# Patient Record
Sex: Female | Born: 1975 | Race: White | Hispanic: No | Marital: Married | Smoking: Never smoker
Health system: Southern US, Community
[De-identification: ages and names within clinical notes are randomized; demographics above are authoritative.]

## PROBLEM LIST (undated history)

## (undated) DIAGNOSIS — K5090 Crohn's disease, unspecified, without complications: Secondary | ICD-10-CM

## (undated) DIAGNOSIS — Z8489 Family history of other specified conditions: Secondary | ICD-10-CM

## (undated) DIAGNOSIS — T8859XA Other complications of anesthesia, initial encounter: Secondary | ICD-10-CM

## (undated) DIAGNOSIS — B019 Varicella without complication: Secondary | ICD-10-CM

## (undated) DIAGNOSIS — N809 Endometriosis, unspecified: Secondary | ICD-10-CM

## (undated) DIAGNOSIS — Z789 Other specified health status: Secondary | ICD-10-CM

## (undated) DIAGNOSIS — T4145XA Adverse effect of unspecified anesthetic, initial encounter: Secondary | ICD-10-CM

## (undated) HISTORY — PX: CERVICAL BIOPSY  W/ LOOP ELECTRODE EXCISION: SUR135

## (undated) HISTORY — PX: OTHER SURGICAL HISTORY: SHX169

## (undated) HISTORY — PX: COLON SURGERY: SHX602

## (undated) HISTORY — PX: DIAGNOSTIC LAPAROSCOPY: SUR761

## (undated) HISTORY — PX: ABDOMINAL HYSTERECTOMY: SHX81

---

## 2007-09-24 ENCOUNTER — Ambulatory Visit: Payer: Self-pay | Admitting: Unknown Physician Specialty

## 2007-10-22 ENCOUNTER — Ambulatory Visit: Payer: Self-pay | Admitting: Unknown Physician Specialty

## 2008-03-04 ENCOUNTER — Ambulatory Visit: Payer: Self-pay | Admitting: Unknown Physician Specialty

## 2008-07-02 ENCOUNTER — Ambulatory Visit: Payer: Self-pay | Admitting: Unknown Physician Specialty

## 2008-07-28 ENCOUNTER — Ambulatory Visit: Payer: Self-pay | Admitting: Unknown Physician Specialty

## 2008-08-20 ENCOUNTER — Ambulatory Visit: Payer: Self-pay | Admitting: Surgery

## 2008-08-25 ENCOUNTER — Inpatient Hospital Stay: Payer: Self-pay | Admitting: Surgery

## 2009-02-15 ENCOUNTER — Ambulatory Visit: Payer: Self-pay | Admitting: Unknown Physician Specialty

## 2013-04-03 ENCOUNTER — Ambulatory Visit: Payer: Self-pay | Admitting: Podiatry

## 2014-12-02 ENCOUNTER — Other Ambulatory Visit: Payer: Self-pay | Admitting: Family Medicine

## 2014-12-02 ENCOUNTER — Ambulatory Visit
Admission: RE | Admit: 2014-12-02 | Discharge: 2014-12-02 | Disposition: A | Discharge status: Home / Self Care | Payer: 59 | Source: Ambulatory Visit | Attending: Family Medicine | Admitting: Family Medicine

## 2014-12-02 DIAGNOSIS — R109 Unspecified abdominal pain: Secondary | ICD-10-CM | POA: Diagnosis present

## 2014-12-02 DIAGNOSIS — N1330 Unspecified hydronephrosis: Secondary | ICD-10-CM | POA: Insufficient documentation

## 2015-11-01 ENCOUNTER — Other Ambulatory Visit: Payer: Self-pay | Admitting: Unknown Physician Specialty

## 2015-11-01 DIAGNOSIS — Z1231 Encounter for screening mammogram for malignant neoplasm of breast: Secondary | ICD-10-CM

## 2015-11-10 ENCOUNTER — Ambulatory Visit
Admission: RE | Admit: 2015-11-10 | Discharge: 2015-11-10 | Disposition: A | Discharge status: Home / Self Care | Payer: 59 | Source: Ambulatory Visit | Attending: Unknown Physician Specialty | Admitting: Unknown Physician Specialty

## 2015-11-10 DIAGNOSIS — Z1231 Encounter for screening mammogram for malignant neoplasm of breast: Secondary | ICD-10-CM

## 2016-05-04 ENCOUNTER — Encounter: Payer: Self-pay | Admitting: *Deleted

## 2016-05-07 ENCOUNTER — Ambulatory Visit: Payer: 59 | Admitting: Anesthesiology

## 2016-05-07 ENCOUNTER — Ambulatory Visit
Admission: RE | Admit: 2016-05-07 | Discharge: 2016-05-07 | Disposition: A | Discharge status: Home / Self Care | Payer: 59 | Source: Ambulatory Visit | Attending: Gastroenterology | Admitting: Gastroenterology

## 2016-05-07 ENCOUNTER — Encounter: Payer: Self-pay | Admitting: *Deleted

## 2016-05-07 ENCOUNTER — Encounter
Admission: RE | Disposition: A | Discharge status: Home / Self Care | Payer: Self-pay | Source: Ambulatory Visit | Attending: Gastroenterology

## 2016-05-07 ENCOUNTER — Other Ambulatory Visit: Payer: Self-pay | Admitting: Gastroenterology

## 2016-05-07 DIAGNOSIS — Z7951 Long term (current) use of inhaled steroids: Secondary | ICD-10-CM | POA: Insufficient documentation

## 2016-05-07 DIAGNOSIS — Z1211 Encounter for screening for malignant neoplasm of colon: Secondary | ICD-10-CM | POA: Diagnosis present

## 2016-05-07 DIAGNOSIS — K50919 Crohn's disease, unspecified, with unspecified complications: Secondary | ICD-10-CM

## 2016-05-07 DIAGNOSIS — K5080 Crohn's disease of both small and large intestine without complications: Secondary | ICD-10-CM | POA: Diagnosis not present

## 2016-05-07 DIAGNOSIS — Z980 Intestinal bypass and anastomosis status: Secondary | ICD-10-CM | POA: Diagnosis not present

## 2016-05-07 HISTORY — DX: Endometriosis, unspecified: N80.9

## 2016-05-07 HISTORY — DX: Varicella without complication: B01.9

## 2016-05-07 HISTORY — DX: Crohn's disease, unspecified, without complications: K50.90

## 2016-05-07 HISTORY — PX: COLONOSCOPY WITH PROPOFOL: SHX5780

## 2016-05-07 LAB — POCT PREGNANCY, URINE: PREG TEST UR: NEGATIVE

## 2016-05-07 SURGERY — COLONOSCOPY WITH PROPOFOL
Anesthesia: General

## 2016-05-07 MED ORDER — SODIUM CHLORIDE 0.9 % IV SOLN
INTRAVENOUS | Status: DC
  Administered 2016-05-07: 1000 mL via INTRAVENOUS

## 2016-05-07 MED ORDER — PROPOFOL 500 MG/50ML IV EMUL
INTRAVENOUS | Status: DC | PRN
  Administered 2016-05-07: 100 ug/kg/min via INTRAVENOUS

## 2016-05-07 MED ORDER — FENTANYL CITRATE (PF) 100 MCG/2ML IJ SOLN
INTRAMUSCULAR | Status: DC | PRN
  Administered 2016-05-07: 50 ug via INTRAVENOUS

## 2016-05-07 MED ORDER — LIDOCAINE HCL (CARDIAC) 20 MG/ML IV SOLN
INTRAVENOUS | Status: DC | PRN
  Administered 2016-05-07: 25 mg via INTRAVENOUS

## 2016-05-07 MED ORDER — PROPOFOL 10 MG/ML IV BOLUS
INTRAVENOUS | Status: DC | PRN
  Administered 2016-05-07: 700 mg via INTRAVENOUS

## 2016-05-07 MED ORDER — SODIUM CHLORIDE 0.9 % IV SOLN: INTRAVENOUS | Status: DC

## 2016-05-07 NOTE — Anesthesia Preprocedure Evaluation (Signed)
Anesthesia Evaluation  Patient identified by MRN, date of birth, ID band Patient awake    Reviewed: Allergy & Precautions, H&P , NPO status , Patient's Chart, lab work & pertinent test results, reviewed documented beta blocker date and time   Airway Mallampati: II   Neck ROM: full    Dental  (+) Poor Dentition   Pulmonary neg pulmonary ROS,    Pulmonary exam normal        Cardiovascular negative cardio ROS Normal cardiovascular exam     Neuro/Psych negative neurological ROS  negative psych ROS   GI/Hepatic negative GI ROS, Neg liver ROS,   Endo/Other  negative endocrine ROS  Renal/GU negative Renal ROS  negative genitourinary   Musculoskeletal   Abdominal   Peds  Hematology negative hematology ROS (+)   Anesthesia Other Findings Past Medical History: No date: Chicken pox No date: Crohn disease (HCC) No date: Endometriosis Past Surgical History: No date: CERVICAL BIOPSY  W/ LOOP ELECTRODE EXCISION No date: COLON SURGERY     Comment: partial enterectomy with repair of sigmoid               colon BMI    Body Mass Index:  30.65 kg/m     Reproductive/Obstetrics                             Anesthesia Physical Anesthesia Plan  ASA: II  Anesthesia Plan: General   Post-op Pain Management:    Induction:   Airway Management Planned:   Additional Equipment:   Intra-op Plan:   Post-operative Plan:   Informed Consent: I have reviewed the patients History and Physical, chart, labs and discussed the procedure including the risks, benefits and alternatives for the proposed anesthesia with the patient or authorized representative who has indicated his/her understanding and acceptance.   Dental Advisory Given  Plan Discussed with: CRNA  Anesthesia Plan Comments:         Anesthesia Quick Evaluation

## 2016-05-07 NOTE — Transfer of Care (Signed)
Immediate Anesthesia Transfer of Care Note  Patient: Alicia Townsend  Procedure(s) Performed: Procedure(s): COLONOSCOPY WITH PROPOFOL (N/A)  Patient Location: PACU and Endoscopy Unit  Anesthesia Type:General  Level of Consciousness: awake  Airway & Oxygen Therapy: Patient Spontanous Breathing  Post-op Assessment: Report given to RN  Post vital signs: stable  Last Vitals:  Vitals:   05/07/16 0853 05/07/16 1059  BP: 123/80 106/76  Pulse: (!) 105 88  Resp: 16 (!) 21  Temp: 36.6 C 36.7 C    Last Pain:  Vitals:   05/07/16 1059  TempSrc: Tympanic         Complications: No apparent anesthesia complications

## 2016-05-07 NOTE — Anesthesia Postprocedure Evaluation (Signed)
Anesthesia Post Note  Patient: Alicia Townsend  Procedure(s) Performed: Procedure(s) (LRB): COLONOSCOPY WITH PROPOFOL (N/A)  Patient location during evaluation: PACU Anesthesia Type: General Level of consciousness: awake and alert Pain management: pain level controlled Vital Signs Assessment: post-procedure vital signs reviewed and stable Respiratory status: spontaneous breathing, nonlabored ventilation, respiratory function stable and patient connected to nasal cannula oxygen Cardiovascular status: blood pressure returned to baseline and stable Postop Assessment: no signs of nausea or vomiting Anesthetic complications: no    Last Vitals:  Vitals:   05/07/16 1119 05/07/16 1129  BP: 117/63 109/70  Pulse: 86 81  Resp: 19 17  Temp:      Last Pain:  Vitals:   05/07/16 1059  TempSrc: Tympanic                 Yevette EdwardsJames G Adams

## 2016-05-07 NOTE — H&P (Signed)
Outpatient short stay form Pre-procedure 05/07/2016 10:22 AM Alicia Sails MD  Primary Physician: Dr Derinda Late  Reason for visit:  Colonoscopy  History of present illness:  Patient is a 40 year old female presenting today as above. She has personal history of Crohn's disease with her last colonoscopy being done 02/15/2009. She has a history of Crohn's disease of the large and small bowel. She did have a surgery in the past which sounds like a ileocectomy. She had been on symptoms he up to about 6 months ago.  She feels overall she is doing well. She is not currently on a maintenance medication. She tolerated her prep well. She denies use of any aspirin or blood thinning agents.    Current Facility-Administered Medications:  .  0.9 %  sodium chloride infusion, , Intravenous, Continuous, Alicia Sails, MD, Last Rate: 20 mL/hr at 05/07/16 0909, 1,000 mL at 05/07/16 0909 .  0.9 %  sodium chloride infusion, , Intravenous, Continuous, Alicia Sails, MD  Prescriptions Prior to Admission  Medication Sig Dispense Refill Last Dose  . Certolizumab Pegol 2 X 200 MG/ML KIT Inject into the skin. Certolizumab pegol 863OT sq dose x1. Then repeat in 2 weeks. Than maintenance dosing of 437m sq monthly   Past Month at Unknown time  . fluticasone (FLONASE) 50 MCG/ACT nasal spray Place 1 spray into both nostrils 2 (two) times daily.   Not Taking at Unknown time  . medroxyPROGESTERone (PROVERA) 10 MG tablet Take 10 mg by mouth daily.   Not Taking at Unknown time     No Known Allergies   Past Medical History:  Diagnosis Date  . Chicken pox   . Crohn disease ([place]   . Endometriosis     Review of systems:      Physical Exam    Heart and lungs: Regular rate and rhythm without rub or gallop, lungs are bilaterally clear.   HEENT: Normocephalic atraumatic eyes are anicteric    Other:     Pertinant exam for procedure: Soft nontender nondistended bowel sounds positive  normoactive.    Planned proceedures: Colonoscopy and indicated procedures.    MLollie Sails MD Gastroenterology 05/07/2016  10:22 AM

## 2016-05-07 NOTE — Op Note (Signed)
[facility] Gastroenterology Patient Name: Alicia LofflerCrystal Townsend Procedure Date: 05/07/2016 10:28 AM MRN: [id number] Account #: [id number] Date of Birth: 09/30/1975 Admit Type: Outpatient Age: [age] [facility] Gender: Female Note Status: Finalized Procedure:            Colonoscopy Indications:          Personal history of Crohn's disease Providers:            Christena DeemMartin U. Skulskie, MD Referring MD:         Hassell HalimMarcus E. Babaoff MD (Referring MD) Medicines:            Monitored Anesthesia Care Complications:        No immediate complications. Procedure:            Pre-Anesthesia Assessment:                       - ASA Grade Assessment: II - A patient with mild                        systemic disease.                       After obtaining informed consent, the colonoscope was                        passed under direct vision. Throughout the procedure,                        the patient's blood pressure, pulse, and oxygen                        saturations were monitored continuously. The                        Colonoscope was introduced through the anus and                        advanced to the the ileocolonic anastomosis. The                        colonoscopy was performed without difficulty. The                        patient tolerated the procedure well. The quality of                        the bowel preparation was good. Findings:      There was evidence of a prior functional end-to-end ileo-colonic       anastomosis in the proximal ascending colon. This was patent and was       characterized by healthy appearing mucosa. The anastomosis was traversed.      Biopsies for histology were taken with a cold forceps from the ascending       colon, transverse colon, descending colon, sigmoid colon and rectum and       neoterminal ileum for evaluation of microscopic colitis.      The digital rectal exam was normal. Pertinent negatives include possible       small  non-inflamed old filtular opening at the anterior anal orifice.      The retroflexed view of the distal rectum and anal verge  was normal and       showed no anal or rectal abnormalities. Impression:           - Patent functional end-to-end ileo-colonic                        anastomosis, characterized by healthy appearing mucosa.                       - The distal rectum and anal verge are normal on                        retroflexion view.                       - Biopsies were taken with a cold forceps from the                        ascending colon, transverse colon, descending colon,                        sigmoid colon and rectum for evaluation of microscopic                        colitis. Recommendation:       - Discharge patient to home. Procedure Code(s):    --- Professional ---                       [phone], Colonoscopy, flexible; with biopsy, single or                        multiple Diagnosis Code(s):    --- Professional ---                       Z98.0, Intestinal bypass and anastomosis status                       Z87.19, Personal history of other diseases of the                        digestive system CPT copyright 2016 American Medical Association. All rights reserved. The codes documented in this report are preliminary and upon coder review may  be revised to meet current compliance requirements. Christena DeemMartin U Skulskie, MD 05/07/2016 11:00:26 AM This report has been signed electronically. Number of Addenda: 0 Note Initiated On: 05/07/2016 10:28 AM Scope Withdrawal Time: 0 hours 12 minutes 23 seconds  Total Procedure Duration: 0 hours 16 minutes 28 seconds       [facility]

## 2016-05-08 LAB — SURGICAL PATHOLOGY

## 2016-05-09 ENCOUNTER — Encounter: Payer: Self-pay | Admitting: Gastroenterology

## 2016-05-15 ENCOUNTER — Ambulatory Visit
Admission: RE | Admit: 2016-05-15 | Discharge: 2016-05-15 | Disposition: A | Discharge status: Home / Self Care | Payer: 59 | Source: Ambulatory Visit | Attending: Gastroenterology | Admitting: Gastroenterology

## 2016-05-15 DIAGNOSIS — K50919 Crohn's disease, unspecified, with unspecified complications: Secondary | ICD-10-CM | POA: Insufficient documentation

## 2016-05-31 ENCOUNTER — Other Ambulatory Visit: Payer: 59

## 2016-05-31 DIAGNOSIS — Z01812 Encounter for preprocedural laboratory examination: Secondary | ICD-10-CM | POA: Diagnosis present

## 2016-05-31 LAB — TYPE AND SCREEN
ABO/RH(D): O NEG
Antibody Screen: NEGATIVE

## 2016-05-31 NOTE — Patient Instructions (Signed)
  Your procedure is scheduled on: 06/05/16 Report to Day Surgery. [facility]OND FLOOR To find out your arrival time please call [phone] between 1PM - 3PM on 06/04/16.  Remember: Instructions that are not followed completely may result in serious medical risk, up to and including death, or upon the discretion of your surgeon and anesthesiologist your surgery may need to be rescheduled.    _X_ 1. Do not eat food or drink liquids after midnight. No gum chewing or hard candies.     ____ 2. No Alcohol for 24 hours before or after surgery.   ____ 3. Do Not Smoke For 24 Hours Prior to Your Surgery.   ____ 4. Bring all medications with you on the day of surgery if instructed.    __X 5. Notify your doctor if there is any change in your medical condition     (cold, fever, infections).       Do not wear jewelry, make-up, hairpins, clips or nail polish.  Do not wear lotions, powders, or perfumes. You may wear deodorant.  Do not shave 48 hours prior to surgery. Men may shave face and neck.  Do not bring valuables to the hospital.    [facility] is not responsible for any belongings or valuables.               Contacts, dentures or bridgework may not be worn into surgery.  Leave your suitcase in the car. After surgery it may be brought to your room.  For patients admitted to the hospital, discharge time is determined by your                treatment team.   Patients discharged the day of surgery will not be allowed to drive home.   ____ Take these medicines the morning of surgery with A SIP OF WATER:    1. NONE  2.   3.   4.  5.  6.  ____ Fleet Enema (as directed)   X___ Use CHG Soap as directed  ____ Use inhalers on the day of surgery  ____ Stop metformin 2 days prior to surgery    ____ Take 1/2 of usual insulin dose the night before surgery and none on the morning of surgery.   ____ Stop Coumadin/Plavix/aspirin on   ____ Stop Anti-inflammatories on    ____ Stop  supplements until after surgery.    ____ Bring C-Pap to the hospital.

## 2016-06-01 ENCOUNTER — Encounter
Admission: RE | Admit: 2016-06-01 | Discharge: 2016-06-01 | Disposition: A | Discharge status: Home / Self Care | Payer: 59 | Source: Ambulatory Visit | Attending: Obstetrics and Gynecology | Admitting: Obstetrics and Gynecology

## 2016-06-01 ENCOUNTER — Other Ambulatory Visit: Payer: 59

## 2016-06-01 DIAGNOSIS — Z01812 Encounter for preprocedural laboratory examination: Secondary | ICD-10-CM | POA: Diagnosis not present

## 2016-06-01 HISTORY — DX: Other complications of anesthesia, initial encounter: T88.59XA

## 2016-06-01 HISTORY — DX: Family history of other specified conditions: Z84.89

## 2016-06-01 HISTORY — DX: Adverse effect of unspecified anesthetic, initial encounter: T41.45XA

## 2016-06-01 HISTORY — DX: Other specified health status: Z78.9

## 2016-06-01 LAB — CBC
HCT: 40.8 % (ref 35.0–47.0)
HEMOGLOBIN: 14 g/dL (ref 12.0–16.0)
MCH: 29.6 pg (ref 26.0–34.0)
MCHC: 34.2 g/dL (ref 32.0–36.0)
MCV: 86.7 fL (ref 80.0–100.0)
PLATELETS: 262 10*3/uL (ref 150–440)
RBC: 4.71 MIL/uL (ref 3.80–5.20)
RDW: 13.8 % (ref 11.5–14.5)
WBC: 9.5 10*3/uL (ref 3.6–11.0)

## 2016-06-01 LAB — HCG, QUANTITATIVE, PREGNANCY

## 2016-06-05 ENCOUNTER — Observation Stay
Admission: RE | Admit: 2016-06-05 | Discharge: 2016-06-06 | Disposition: A | Discharge status: Home / Self Care | Payer: 59 | Source: Ambulatory Visit | Attending: Obstetrics and Gynecology | Admitting: Obstetrics and Gynecology

## 2016-06-05 ENCOUNTER — Ambulatory Visit: Payer: 59 | Admitting: Anesthesiology

## 2016-06-05 ENCOUNTER — Encounter
Admission: RE | Disposition: A | Discharge status: Home / Self Care | Payer: Self-pay | Source: Ambulatory Visit | Attending: Obstetrics and Gynecology

## 2016-06-05 ENCOUNTER — Encounter: Payer: Self-pay | Admitting: *Deleted

## 2016-06-05 DIAGNOSIS — Z90710 Acquired absence of both cervix and uterus: Secondary | ICD-10-CM | POA: Diagnosis present

## 2016-06-05 DIAGNOSIS — N920 Excessive and frequent menstruation with regular cycle: Principal | ICD-10-CM | POA: Insufficient documentation

## 2016-06-05 DIAGNOSIS — N800 Endometriosis of uterus: Secondary | ICD-10-CM | POA: Insufficient documentation

## 2016-06-05 DIAGNOSIS — N946 Dysmenorrhea, unspecified: Secondary | ICD-10-CM | POA: Insufficient documentation

## 2016-06-05 HISTORY — PX: LAPAROSCOPIC HYSTERECTOMY: SHX1926

## 2016-06-05 HISTORY — PX: CYSTOSCOPY: SHX5120

## 2016-06-05 LAB — POCT PREGNANCY, URINE: PREG TEST UR: NEGATIVE

## 2016-06-05 LAB — ABO/RH: ABO/RH(D): O NEG

## 2016-06-05 SURGERY — HYSTERECTOMY, TOTAL, LAPAROSCOPIC
Anesthesia: General | Laterality: Bilateral

## 2016-06-05 MED ORDER — KETOROLAC TROMETHAMINE 30 MG/ML IJ SOLN
30.0000 mg | Freq: Four times a day (QID) | INTRAMUSCULAR | Status: DC
  Administered 2016-06-05 – 2016-06-06 (×4): 30 mg via INTRAVENOUS
  Filled 2016-06-05 (×4): qty 1

## 2016-06-05 MED ORDER — ROCURONIUM BROMIDE 100 MG/10ML IV SOLN
INTRAVENOUS | Status: DC | PRN
  Administered 2016-06-05: 20 mg via INTRAVENOUS
  Administered 2016-06-05: 30 mg via INTRAVENOUS
  Administered 2016-06-05: 50 mg via INTRAVENOUS

## 2016-06-05 MED ORDER — ONDANSETRON HCL 4 MG/2ML IJ SOLN
INTRAMUSCULAR | Status: DC | PRN
  Administered 2016-06-05: 4 mg via INTRAVENOUS

## 2016-06-05 MED ORDER — FENTANYL CITRATE (PF) 100 MCG/2ML IJ SOLN
INTRAMUSCULAR | Status: DC | PRN
  Administered 2016-06-05: 100 ug via INTRAVENOUS
  Administered 2016-06-05 (×2): 50 ug via INTRAVENOUS

## 2016-06-05 MED ORDER — PROMETHAZINE HCL 25 MG/ML IJ SOLN: 12.5000 mg | Freq: Four times a day (QID) | INTRAMUSCULAR | Status: DC | PRN

## 2016-06-05 MED ORDER — CEFAZOLIN SODIUM-DEXTROSE 2-4 GM/100ML-% IV SOLN
INTRAVENOUS | Status: AC
  Filled 2016-06-05: qty 100

## 2016-06-05 MED ORDER — OXYCODONE HCL 5 MG PO TABS: 5.0000 mg | ORAL_TABLET | Freq: Once | ORAL | Status: DC | PRN

## 2016-06-05 MED ORDER — LIDOCAINE HCL (CARDIAC) 20 MG/ML IV SOLN
INTRAVENOUS | Status: DC | PRN
Start: 2016-06-05 — End: 2016-06-05
  Administered 2016-06-05: 100 mg via INTRAVENOUS

## 2016-06-05 MED ORDER — DEXTROSE-NACL 5-0.45 % IV SOLN
INTRAVENOUS | Status: DC
  Administered 2016-06-05 – 2016-06-06 (×2): via INTRAVENOUS

## 2016-06-05 MED ORDER — ACETAMINOPHEN 10 MG/ML IV SOLN
INTRAVENOUS | Status: DC | PRN
  Administered 2016-06-05: 1000 mg via INTRAVENOUS

## 2016-06-05 MED ORDER — PROPOFOL 10 MG/ML IV BOLUS
INTRAVENOUS | Status: DC | PRN
  Administered 2016-06-05: 150 mg via INTRAVENOUS

## 2016-06-05 MED ORDER — LACTATED RINGERS IV SOLN
INTRAVENOUS | Status: DC
  Administered 2016-06-05 (×2): via INTRAVENOUS

## 2016-06-05 MED ORDER — BUPIVACAINE HCL (PF) 0.5 % IJ SOLN
INTRAMUSCULAR | Status: AC
  Filled 2016-06-05: qty 30

## 2016-06-05 MED ORDER — ACETAMINOPHEN 10 MG/ML IV SOLN
INTRAVENOUS | Status: AC
  Filled 2016-06-05: qty 100

## 2016-06-05 MED ORDER — MORPHINE SULFATE (PF) 2 MG/ML IV SOLN: 1.0000 mg | INTRAVENOUS | Status: DC | PRN

## 2016-06-05 MED ORDER — FENTANYL CITRATE (PF) 100 MCG/2ML IJ SOLN
25.0000 ug | INTRAMUSCULAR | Status: DC | PRN
  Administered 2016-06-05 (×3): 50 ug via INTRAVENOUS

## 2016-06-05 MED ORDER — BUPIVACAINE HCL 0.5 % IJ SOLN
INTRAMUSCULAR | Status: DC | PRN
Start: 2016-06-05 — End: 2016-06-05
  Administered 2016-06-05: 25 mL

## 2016-06-05 MED ORDER — PHENYLEPHRINE HCL 10 MG/ML IJ SOLN
INTRAMUSCULAR | Status: DC | PRN
  Administered 2016-06-05: 200 ug via INTRAVENOUS
  Administered 2016-06-05: 100 ug via INTRAVENOUS
  Administered 2016-06-05: 200 ug via INTRAVENOUS
  Administered 2016-06-05 (×3): 100 ug via INTRAVENOUS

## 2016-06-05 MED ORDER — ONDANSETRON HCL 4 MG PO TABS: 4.0000 mg | ORAL_TABLET | Freq: Four times a day (QID) | ORAL | Status: DC | PRN

## 2016-06-05 MED ORDER — OXYCODONE-ACETAMINOPHEN 5-325 MG PO TABS: 1.0000 | ORAL_TABLET | ORAL | Status: DC | PRN

## 2016-06-05 MED ORDER — OXYCODONE HCL 5 MG/5ML PO SOLN: 5.0000 mg | Freq: Once | ORAL | Status: DC | PRN

## 2016-06-05 MED ORDER — FENTANYL CITRATE (PF) 100 MCG/2ML IJ SOLN
INTRAMUSCULAR | Status: AC
  Filled 2016-06-05: qty 2

## 2016-06-05 MED ORDER — FAMOTIDINE 20 MG PO TABS
ORAL_TABLET | ORAL | Status: AC
  Filled 2016-06-05: qty 1

## 2016-06-05 MED ORDER — DEXAMETHASONE SODIUM PHOSPHATE 10 MG/ML IJ SOLN
INTRAMUSCULAR | Status: DC | PRN
  Administered 2016-06-05: 10 mg via INTRAVENOUS

## 2016-06-05 MED ORDER — ONDANSETRON HCL 4 MG/2ML IJ SOLN: 4.0000 mg | Freq: Four times a day (QID) | INTRAMUSCULAR | Status: DC | PRN

## 2016-06-05 MED ORDER — KETOROLAC TROMETHAMINE 30 MG/ML IJ SOLN: 30.0000 mg | Freq: Four times a day (QID) | INTRAMUSCULAR | Status: DC

## 2016-06-05 MED ORDER — SUGAMMADEX SODIUM 200 MG/2ML IV SOLN
INTRAVENOUS | Status: DC | PRN
  Administered 2016-06-05: 160 mg via INTRAVENOUS

## 2016-06-05 MED ORDER — FAMOTIDINE 20 MG PO TABS
20.0000 mg | ORAL_TABLET | Freq: Once | ORAL | Status: AC
  Administered 2016-06-05: 20 mg via ORAL

## 2016-06-05 MED ORDER — MIDAZOLAM HCL 2 MG/2ML IJ SOLN
INTRAMUSCULAR | Status: DC | PRN
Start: 2016-06-05 — End: 2016-06-05
  Administered 2016-06-05: 2 mg via INTRAVENOUS

## 2016-06-05 MED ORDER — MENTHOL 3 MG MT LOZG: 1.0000 | LOZENGE | OROMUCOSAL | Status: DC | PRN

## 2016-06-05 MED ORDER — CEFAZOLIN SODIUM-DEXTROSE 2-4 GM/100ML-% IV SOLN
2.0000 g | Freq: Once | INTRAVENOUS | Status: AC
  Administered 2016-06-05: 2 g via INTRAVENOUS

## 2016-06-05 MED ORDER — SIMETHICONE 80 MG PO CHEW: 80.0000 mg | CHEWABLE_TABLET | Freq: Four times a day (QID) | ORAL | Status: DC | PRN

## 2016-06-05 SURGICAL SUPPLY — 45 items
APPLICATOR ARISTA FLEXITIP XL (MISCELLANEOUS) ×4 IMPLANT
BAG URO DRAIN 2000ML W/SPOUT (MISCELLANEOUS) ×4 IMPLANT
BLADE SURG SZ11 CARB STEEL (BLADE) ×4 IMPLANT
CANISTER SUCT 1200ML W/VALVE (MISCELLANEOUS) ×4 IMPLANT
CATH FOLEY 2WAY  5CC 16FR (CATHETERS) ×2
CATH URTH 16FR FL 2W BLN LF (CATHETERS) ×2 IMPLANT
CHLORAPREP W/TINT 26ML (MISCELLANEOUS) ×4 IMPLANT
GLOVE BIO SURGEON STRL SZ7 (GLOVE) ×20 IMPLANT
GLOVE INDICATOR 7.5 STRL GRN (GLOVE) ×20 IMPLANT
GOWN STRL REUS W/ TWL LRG LVL3 (GOWN DISPOSABLE) ×4 IMPLANT
GOWN STRL REUS W/ TWL XL LVL3 (GOWN DISPOSABLE) ×2 IMPLANT
GOWN STRL REUS W/TWL LRG LVL3 (GOWN DISPOSABLE) ×4
GOWN STRL REUS W/TWL XL LVL3 (GOWN DISPOSABLE) ×2
HEMOSTAT ARISTA ABSORB 1G (MISCELLANEOUS) ×4 IMPLANT
IRRIGATION STRYKERFLOW (MISCELLANEOUS) ×2 IMPLANT
IRRIGATOR STRYKERFLOW (MISCELLANEOUS) ×4
IV LACTATED RINGERS 1000ML (IV SOLUTION) ×4 IMPLANT
IV NS 1000ML (IV SOLUTION) ×2
IV NS 1000ML BAXH (IV SOLUTION) ×2 IMPLANT
KIT PINK PAD W/HEAD ARE REST (MISCELLANEOUS) ×4
KIT PINK PAD W/HEAD ARM REST (MISCELLANEOUS) ×2 IMPLANT
LABEL OR SOLS (LABEL) ×4 IMPLANT
LIQUID BAND (GAUZE/BANDAGES/DRESSINGS) ×4 IMPLANT
MANIPULATOR VCARE LG CRV RETR (MISCELLANEOUS) ×4 IMPLANT
MANIPULATOR VCARE SML CRV RETR (MISCELLANEOUS) IMPLANT
MANIPULATOR VCARE STD CRV RETR (MISCELLANEOUS) IMPLANT
NS IRRIG 500ML POUR BTL (IV SOLUTION) ×4 IMPLANT
OCCLUDER COLPOPNEUMO (BALLOONS) ×4 IMPLANT
PACK GYN LAPAROSCOPIC (MISCELLANEOUS) ×4 IMPLANT
PAD OB MATERNITY 4.3X12.25 (PERSONAL CARE ITEMS) ×4 IMPLANT
PAD PREP 24X41 OB/GYN DISP (PERSONAL CARE ITEMS) ×4 IMPLANT
SET CYSTO W/LG BORE CLAMP LF (SET/KITS/TRAYS/PACK) IMPLANT
SHEARS HARMONIC ACE PLUS 36CM (ENDOMECHANICALS) ×4 IMPLANT
SLEEVE ENDOPATH XCEL 5M (ENDOMECHANICALS) ×12 IMPLANT
SPONGE LAP 18X18 5 PK (GAUZE/BANDAGES/DRESSINGS) ×4 IMPLANT
STRAP SAFETY BODY (MISCELLANEOUS) ×4 IMPLANT
SUT VIC AB 0 CT1 27 (SUTURE) ×2
SUT VIC AB 0 CT1 27XCR 8 STRN (SUTURE) ×2 IMPLANT
SUT VIC AB 2-0 UR6 27 (SUTURE) IMPLANT
SUT VIC AB 4-0 FS2 27 (SUTURE) IMPLANT
SYR 50ML LL SCALE MARK (SYRINGE) ×4 IMPLANT
SYRINGE 10CC LL (SYRINGE) ×4 IMPLANT
TROCAR ENDO BLADELESS 11MM (ENDOMECHANICALS) ×4 IMPLANT
TROCAR XCEL NON-BLD 5MMX100MML (ENDOMECHANICALS) ×4 IMPLANT
TUBING INSUFFLATOR HEATED (MISCELLANEOUS) ×4 IMPLANT

## 2016-06-05 NOTE — Anesthesia Preprocedure Evaluation (Signed)
Anesthesia Evaluation  Patient identified by MRN, date of birth, ID band Patient awake    Reviewed: Allergy & Precautions, H&P , NPO status , Patient's Chart, lab work & pertinent test results  History of Anesthesia Complications (+) Family history of anesthesia reaction and history of anesthetic complications  Airway Mallampati: III  TM Distance: >3 FB Neck ROM: full    Dental no notable dental hx. (+) Poor Dentition, Chipped   Pulmonary neg pulmonary ROS, neg shortness of breath,    Pulmonary exam normal breath sounds clear to auscultation       Cardiovascular Exercise Tolerance: Good (-) angina(-) Past MI and (-) DOE negative cardio ROS Normal cardiovascular exam Rhythm:regular Rate:Normal     Neuro/Psych negative neurological ROS  negative psych ROS   GI/Hepatic negative GI ROS, Neg liver ROS, neg GERD  ,  Endo/Other  negative endocrine ROS  Renal/GU      Musculoskeletal   Abdominal   Peds  Hematology negative hematology ROS (+)   Anesthesia Other Findings Past Medical History: No date: Chicken pox No date: Complication of anesthesia No date: Crohn disease (HCC) No date: Endometriosis No date: Family history of adverse reaction to anesthes*     Comment: grandfather. will try to get more info from               family No date: Medical history non-contributory  Past Surgical History: No date: CERVICAL BIOPSY  W/ LOOP ELECTRODE EXCISION No date: COLON SURGERY     Comment: partial enterectomy with repair of sigmoid               colon 05/07/2016: COLONOSCOPY WITH PROPOFOL N/A     Comment: Procedure: COLONOSCOPY WITH PROPOFOL;                Surgeon: Christena DeemMartin U Skulskie, MD;  [facility];  Service: Endoscopy;  Laterality:               N/A;  BMI    Body Mass Index:  30.51 kg/m      Reproductive/Obstetrics negative OB ROS                              Anesthesia Physical Anesthesia Plan  ASA: III  Anesthesia Plan: General ETT   Post-op Pain Management:    Induction:   Airway Management Planned:   Additional Equipment:   Intra-op Plan:   Post-operative Plan:   Informed Consent: I have reviewed the patients History and Physical, chart, labs and discussed the procedure including the risks, benefits and alternatives for the proposed anesthesia with the patient or authorized representative who has indicated his/her understanding and acceptance.   Dental Advisory Given  Plan Discussed with: Anesthesiologist, CRNA and Surgeon  Anesthesia Plan Comments:         Anesthesia Quick Evaluation

## 2016-06-05 NOTE — Transfer of Care (Signed)
Immediate Anesthesia Transfer of Care Note  Patient: Alicia Townsend  Procedure(s) Performed: Procedure(s): HYSTERECTOMY TOTAL LAPAROSCOPIC BILATERAL SALPINGECTOMY (Bilateral)  Patient Location: PACU  Anesthesia Type:General  Level of Consciousness: awake, alert , oriented and patient cooperative  Airway & Oxygen Therapy: Patient Spontanous Breathing and Patient connected to face mask oxygen  Post-op Assessment: Report given to RN, Post -op Vital signs reviewed and stable and Patient moving all extremities X 4  Post vital signs: Reviewed and stable  Last Vitals:  Vitals:   06/05/16 0605  BP: 138/89  Pulse: (!) 101  Resp: 14  Temp: 36.5 C    Last Pain:  Vitals:   06/05/16 0605  TempSrc: Tympanic         Complications: No apparent anesthesia complications

## 2016-06-05 NOTE — Anesthesia Postprocedure Evaluation (Signed)
Anesthesia Post Note  Patient: Alicia Townsend [name]  Procedure(s) Performed: Procedure(s) (LRB): HYSTERECTOMY TOTAL LAPAROSCOPIC (Bilateral) CYSTOSCOPY  Patient location during evaluation: PACU Anesthesia Type: General Level of consciousness: awake and alert Pain management: pain level controlled Vital Signs Assessment: post-procedure vital signs reviewed and stable Respiratory status: spontaneous breathing, nonlabored ventilation, respiratory function stable and patient connected to nasal cannula oxygen Cardiovascular status: blood pressure returned to baseline and stable Postop Assessment: no signs of nausea or vomiting Anesthetic complications: no     Last Vitals:  Vitals:   06/05/16 1102 06/05/16 1115  BP: 117/68 111/64  Pulse: 81 78  Resp:    Temp:  36.3 C    Last Pain:  Vitals:   06/05/16 1115  TempSrc:   PainSc: 4                  Cleda MccreedyJoseph K Charell Faulk

## 2016-06-05 NOTE — Op Note (Signed)
Preoperative Diagnosis: 1) 40 y.o. with menorrhagia and dysmenorrhea  Postoperative Diagnosis: 1) 40 y.o. with menorrhagia and dysmenorrhea  Operation Performed: Laparoscopic total hysterectomy and cystoscopy  Indication: 40 y.o. with menorrhagia and dysmenorrhea failing medical managment  Surgeon: Vena AustriaAndreas Pyper Olexa, MD  Assistant: Thomasene MohairStephen Jackson, MD  Anesthesia: General  Preoperative Antibiotics: 2g ancef  Estimated Blood Loss: 200mL  IV Fluids: 1L  Drains: Foley to gravity drainage  Implants: none  Specimens Removed: Uterus and cervix  Complications: none  Intraoperative Findings: Scarring of uterus to posterior cul de sac, scarring of tubes and ovaries to pelvic sidewall.  No adhesive disease encountered from patients prior laparotomy.  Cystoscopy revealed an intact bladder dome and bilateral efflux of urine from both ureteral orifices.   Patient Condition: stable  Procedure in Detail:  Patient was taken to the operating room where she was administered general anesthesia.  She was positioned in the dorsal lithotomy position utilizing Allen stirups, prepped and draped in the usual sterile fashion.  Prior to proceeding with procedure a time out was performed.  Attention was turned to the patient's pelvis.  A red rubber catheter was used to empty the patient's bladder.  An operative speculum was placed to allow visualization of the cervix.  The anterior lip of the cervix was grasped with a single tooth tenaculum, and a V-care uterine manipulator was placed to allow manipulation of the uterus.  The operative speculum and single tooth tenaculum were then removed.  Attention was turned to the patient's abdomen.  Palmer's point was infiltrated with 0.5% Bupivocaine, before making a stab incision using an 11 blade scalpel.  A 5mm Excel trocar was then used to gain direct entry into the peritoneal cavity utilizing the camera to visualize progress of the trocar during placement.  Once  peritoneal entry had been achieved, insufflation was started and pneumoperitoneum established at a pressure of 15mmHg.   General inspection of the abdomen revealed the above noted findings.  Three additional 5mm excel trocars (infraumbilical, left and right lateral) were place through these incision under direct visualization.  The uterus was elevated using the V-care and the posterior cul de sac adhesions were taken down bluntly.  The left adnexal structures were visualized.  The left round ligament was transected using a 5mm harmonic.  The anterior leaf of the broad ligament was dissected down to the level of the internal cervical os, and a bladder flap was started.  The left fallopian tube and utero-ovarian ligament were then dissected.  The left ovary was adhered to the uterine serosa and was bluntly dissected free.  The same procedure was then repeated for the patients right adnexal structures.  At this point a supracervical hysterectomy was performed.  This allowed better visualization of the V-care cup outline and allowed the uterine artery to be and ovaries to be further lateralized off the cervix to ensure the ureters were sufficiently dropped away from the site of dissection.  A colpotomy was started with the harmonic scalpel, carried around circumferentially staying within the V-care cup.  The cervix and uterus were removed vaginally.  '  Attention was turned to the patient pelvis.  Operative speculum was replaced the anterior posterior cuff were grasped with long Alice clamps and the colpotomy was closed vaginally using interrupted figure of 8 throws of 0-Vicryl.  Cuff was noted to be fully closed and hemostatic before removing the operative speculum.  The foley catheter was removed, cystoscopy was performed noting the above findings.  After withdrawal of the  cystoscope the foley was replaced.    Pneumoperitoneum was established the vaginal cuff was inspected noted to be free of loops of bowl and  hemostatic from the abdominal side as well.  Pelvis was irrigated.  Arista was applied to all pedicled were evacuating pneumoperitoneum.  The trocars were removed.  All trocar sites were then dressed with surgical skin glue.  Sponge needle and instrument counts were correct time two.  The patient tolerated the procedure well and was taken to the recovery room in stable condition.

## 2016-06-05 NOTE — H&P (Signed)
Date of Initial paper H&P: 12/1$/17  History reviewed, patient examined, no change in status, stable for surgery.

## 2016-06-05 NOTE — Anesthesia Procedure Notes (Signed)
Procedure Name: Intubation Date/Time: 06/05/2016 7:44 AM Performed by: Michaele OfferSAVAGE, Cortne Amara Pre-anesthesia Checklist: Patient identified, Emergency Drugs available, Suction available, Patient being monitored and Timeout performed Patient Re-evaluated:Patient Re-evaluated prior to inductionOxygen Delivery Method: Circle system utilized Preoxygenation: Pre-oxygenation with 100% oxygen Intubation Type: IV induction Ventilation: Mask ventilation without difficulty Laryngoscope Size: Mac and 3 Grade View: Grade I Tube type: Oral Tube size: 7.0 mm Number of attempts: 1 Airway Equipment and Method: Stylet Placement Confirmation: ETT inserted through vocal cords under direct vision,  positive ETCO2 and breath sounds checked- equal and bilateral Secured at: 20 cm Tube secured with: Tape Dental Injury: Teeth and Oropharynx as per pre-operative assessment

## 2016-06-06 DIAGNOSIS — N920 Excessive and frequent menstruation with regular cycle: Secondary | ICD-10-CM | POA: Diagnosis not present

## 2016-06-06 LAB — BASIC METABOLIC PANEL
Anion gap: 6 (ref 5–15)
BUN: 8 mg/dL (ref 6–20)
CALCIUM: 8.3 mg/dL — AB (ref 8.9–10.3)
CHLORIDE: 107 mmol/L (ref 101–111)
CO2: 23 mmol/L (ref 22–32)
CREATININE: 0.6 mg/dL (ref 0.44–1.00)
Glucose, Bld: 141 mg/dL — ABNORMAL HIGH (ref 65–99)
Potassium: 3.7 mmol/L (ref 3.5–5.1)
SODIUM: 136 mmol/L (ref 135–145)

## 2016-06-06 LAB — CBC
HCT: 34.7 % — ABNORMAL LOW (ref 35.0–47.0)
Hemoglobin: 11.8 g/dL — ABNORMAL LOW (ref 12.0–16.0)
MCH: 29.4 pg (ref 26.0–34.0)
MCHC: 33.8 g/dL (ref 32.0–36.0)
MCV: 87 fL (ref 80.0–100.0)
PLATELETS: 254 10*3/uL (ref 150–440)
RBC: 3.99 MIL/uL (ref 3.80–5.20)
RDW: 13.8 % (ref 11.5–14.5)
WBC: 28.8 10*3/uL — AB (ref 3.6–11.0)

## 2016-06-06 LAB — SURGICAL PATHOLOGY

## 2016-06-06 MED ORDER — OXYCODONE-ACETAMINOPHEN 5-325 MG PO TABS: 1.0000 | ORAL_TABLET | ORAL | 0 refills | Status: DC | PRN

## 2016-06-06 MED ORDER — IBUPROFEN 600 MG PO TABS
600.0000 mg | ORAL_TABLET | Freq: Four times a day (QID) | ORAL | Status: DC | PRN
  Administered 2016-06-06: 600 mg via ORAL
  Filled 2016-06-06: qty 1

## 2016-06-06 MED ORDER — IBUPROFEN 600 MG PO TABS: 600.0000 mg | ORAL_TABLET | Freq: Four times a day (QID) | ORAL | 0 refills | Status: DC | PRN

## 2016-06-06 NOTE — Discharge Summary (Signed)
Physician Discharge Summary  Patient ID: Alicia Townsend MRN: [id number] DOB/AGE: 41/06/1975 41 y.o.  Admit date: 06/05/2016 Discharge date: 06/06/2016  Admission Diagnoses: Scheduled hysterectomy - menorrhagia and dysmenorrhea  Discharge Diagnoses:  Active Problems:   S/P laparoscopic hysterectomy   Discharged Condition: good  Hospital Course: Patient admitted for scheduled laparoscopic hysterectomy for dysmenorrhea and menorrhagia.  Procedure uncomplicated, with benign postoperative course.  By POD1 patient was meeting all goals including ambulating, remaining hemodynamically stable and afebrile, voided spontaneously, was tolerating general diet and passing flatuts, good pain control and tolerance of po analgesics.  Postoperative renal function normal on laboratory evalation, appropriate H&H.  Postoperative leukocytosis deemed reactive in the setting of benign abdominal exam, otherwise stable vitals.  Consults: None  Significant Diagnostic Studies: Results for orders placed or performed during the hospital encounter of 06/05/16 (from the past 24 hour(s))  Basic metabolic panel     Status: Abnormal   Collection Time: 06/06/16  6:40 AM  Result Value Ref Range   Sodium 136 135 - 145 mmol/L   Potassium 3.7 3.5 - 5.1 mmol/L   Chloride 107 101 - 111 mmol/L   CO2 23 22 - 32 mmol/L   Glucose, Bld 141 (H) 65 - 99 mg/dL   BUN 8 6 - 20 mg/dL   Creatinine, Ser 4.090.60 0.44 - 1.00 mg/dL   Calcium 8.3 (L) 8.9 - 10.3 mg/dL   GFR calc non Af Amer >60 >60 mL/min   GFR calc Af Amer >60 >60 mL/min   Anion gap 6 5 - 15  CBC     Status: Abnormal   Collection Time: 06/06/16  6:40 AM  Result Value Ref Range   WBC 28.8 (H) 3.6 - 11.0 K/uL   RBC 3.99 3.80 - 5.20 MIL/uL   Hemoglobin 11.8 (L) 12.0 - 16.0 g/dL   HCT 81.134.7 (L) 91.435.0 - 78.247.0 %   MCV 87.0 80.0 - 100.0 fL   MCH 29.4 26.0 - 34.0 pg   MCHC 33.8 32.0 - 36.0 g/dL   RDW 95.613.8 21.311.5 - 08.614.5 %   Platelets 254 150 - 440 K/uL   Treatments: surgery:  TLH, cystoscopy  Discharge Exam: Blood pressure 122/72, pulse 85, temperature 98 F (36.7 C), temperature source Oral, resp. rate 18, height 5' 3.5" (1.613 m), weight 175 lb (79.4 kg), last menstrual period 05/05/2016, SpO2 100 %. General appearance: alert, appears stated age and no distress Resp: no increased work of breathing Cardio: RRR GI: soft, non-tender; bowel sounds normal; no masses,  no organomegaly Extremities: extremities normal, atraumatic, no cyanosis or edema Incision/Wound: D/C/I trocar sites  Disposition: 01-Home or Self Care  Discharge Instructions    Activity as tolerated    Complete by:  As directed    Call MD for:  difficulty breathing, headache or visual disturbances    Complete by:  As directed    Call MD for:  extreme fatigue    Complete by:  As directed    Call MD for:  hives    Complete by:  As directed    Call MD for:  persistant dizziness or light-headedness    Complete by:  As directed    Call MD for:  persistant nausea and vomiting    Complete by:  As directed    Call MD for:  redness, tenderness, or signs of infection (pain, swelling, redness, odor or green/yellow discharge around incision site)    Complete by:  As directed    Call MD for:  severe uncontrolled  pain    Complete by:  As directed    Call MD for:  temperature >100.4    Complete by:  As directed    Diet - low sodium heart healthy    Complete by:  As directed    Driving restriction     Complete by:  As directed    Avoid driving for at least 1 weeks.   Lifting restrictions    Complete by:  As directed    Weight restriction of 10 lbs for 6 weeks.   Sexual acrtivity    Complete by:  As directed    No intercourse, tampons, or baths for 6 weeks (showers ok)     Allergies as of 06/06/2016   No Known Allergies     Medication List    STOP taking these medications   acetaminophen 325 MG tablet Commonly known as:  TYLENOL     TAKE these medications   ibuprofen 600 MG  tablet Commonly known as:  ADVIL,MOTRIN Take 1 tablet (600 mg total) by mouth every 6 (six) hours as needed for fever or headache.   oxyCODONE-acetaminophen 5-325 MG tablet Commonly known as:  PERCOCET/ROXICET Take 1-2 tablets by mouth every 4 (four) hours as needed (moderate to severe pain (when tolerating fluids)).      Follow-up Information    Ivory Bail, Florina OuANDREAS M, MD Follow up in 3 week(s).   Specialty:  Obstetrics and Gynecology Why:  For wound re-check Contact information: [address] [place] [address] [phone]           Signed: Lorrene ReidSTAEBLER, Gagandeep Kossman M 06/06/2016, 9:57 AM

## 2016-06-06 NOTE — Progress Notes (Signed)
Patient understands all discharge instructions and the need to attend follow up appointments. Patient discharge via wheelchair with auxillary. 

## 2017-01-14 ENCOUNTER — Ambulatory Visit (INDEPENDENT_AMBULATORY_CARE_PROVIDER_SITE_OTHER): Payer: 59 | Admitting: Obstetrics and Gynecology

## 2017-01-14 ENCOUNTER — Encounter: Payer: Self-pay | Admitting: Obstetrics and Gynecology

## 2017-01-14 DIAGNOSIS — Z01419 Encounter for gynecological examination (general) (routine) without abnormal findings: Secondary | ICD-10-CM

## 2017-01-14 DIAGNOSIS — Z1239 Encounter for other screening for malignant neoplasm of breast: Secondary | ICD-10-CM

## 2017-01-14 DIAGNOSIS — Z1231 Encounter for screening mammogram for malignant neoplasm of breast: Secondary | ICD-10-CM | POA: Diagnosis not present

## 2017-01-14 NOTE — Progress Notes (Signed)
Patient ID: Alicia Townsend, female   DOB: 03/01/1976, 41 y.o.   MRN: [id number]     Gynecology Annual Exam  PCP: Kandyce RudBabaoff, Marcus, MD  Chief Complaint:  Chief Complaint  Patient presents with  . Gynecologic Exam    History of Present Illness: Patient is a 41 y.o. G1P1 presents for annual exam. The patient has no complaints today.   LMP: Patient's last menstrual period was 05/05/2016 (exact date). Absent secondary to to prior hysterectomy secondary to AUB, dysmenorrhea with pathology showing adenomyosis.  The patient is sexually active. She currently uses none for contraception. She denies dyspareunia.  The patient does perform self breast exams.  There is no notable family history of breast or ovarian cancer in her family.  The patient wears seatbelts: yes.   The patient has regular exercise: not asked.    The patient denies current symptoms of depression.    Review of Systems: Review of Systems  Constitutional: Negative for chills and fever.  HENT: Negative for congestion.   Respiratory: Negative for cough and shortness of breath.   Cardiovascular: Negative for chest pain and palpitations.  Gastrointestinal: Negative for abdominal pain, constipation, diarrhea, heartburn, nausea and vomiting.  Genitourinary: Negative for dysuria, frequency and urgency.  Skin: Negative for itching and rash.  Neurological: Negative for dizziness and headaches.  Endo/Heme/Allergies: Negative for polydipsia.  Psychiatric/Behavioral: Negative for depression.    Past Medical History:  Past Medical History:  Diagnosis Date  . Chicken pox   . Complication of anesthesia   . Crohn disease (HCC)   . Endometriosis   . Family history of adverse reaction to anesthesia    grandfather. will try to get more info from family  . Medical history non-contributory     Past Surgical History:  Past Surgical History:  Procedure Laterality Date  . CERVICAL BIOPSY  W/ LOOP ELECTRODE EXCISION    . COLON  SURGERY     partial enterectomy with repair of sigmoid colon  . COLONOSCOPY WITH PROPOFOL N/A 05/07/2016   Procedure: COLONOSCOPY WITH PROPOFOL;  Surgeon: Christena DeemMartin U Skulskie, MD;  Location: [facility] ENDOSCOPY;  Service: Endoscopy;  Laterality: N/A;  . CYSTOSCOPY  06/05/2016   Procedure: CYSTOSCOPY;  Surgeon: Vena AustriaAndreas Annalycia Done, MD;  Location: [facility] ORS;  Service: Gynecology;;  . LAPAROSCOPIC HYSTERECTOMY Bilateral 06/05/2016   Procedure: HYSTERECTOMY TOTAL LAPAROSCOPIC;  Surgeon: Vena AustriaAndreas Amauri Keefe, MD;  Location: [facility] ORS;  Service: Gynecology;  Laterality: Bilateral;    Gynecologic History:  Patient's last menstrual period was 05/05/2016 (exact date). Contraception: none Last Pap: Results were: N/A s/p hysterectomy (TLH) Last mammogram: 11/10/15 Results were: BI-RAD I   Obstetric History: G1P1  Family History:  History reviewed. No pertinent family history.  Social History:  Social History   Social History  . Marital status: Married    Spouse name: N/A  . Number of children: N/A  . Years of education: N/A   Occupational History  . Not on file.   Social History Main Topics  . Smoking status: Never Smoker  . Smokeless tobacco: Never Used  . Alcohol use No  . Drug use: No  . Sexual activity: Yes    Birth control/ protection: Surgical   Other Topics Concern  . Not on file   Social History Narrative  . No narrative on file    Allergies:  No Known Allergies  Medications: Prior to Admission medications   Medication Sig Start Date End Date Taking? Authorizing Provider  escitalopram (LEXAPRO) 10 MG tablet Take by mouth. 12/04/16  03/04/17 Yes [provider]    Physical Exam Vitals: Blood pressure 130/78, pulse 99, height 5\' 4"  (1.626 m), weight 176 lb (79.8 kg), last menstrual period 05/05/2016.  General: NAD HEENT: normocephalic, anicteric Thyroid: no enlargement, no palpable nodules Pulmonary: No increased work of breathing, CTAB Cardiovascular: RRR, distal  pulses 2+ Breast: Breast symmetrical, no tenderness, no palpable nodules or masses, no skin or nipple retraction present, no nipple discharge.  No axillary or supraclavicular lymphadenopathy. Abdomen: NABS, soft, non-tender, non-distended.  Umbilicus without lesions.  No hepatomegaly, splenomegaly or masses palpable. No evidence of hernia  Genitourinary:  External: Normal external female genitalia.  Normal urethral meatus, normal  Bartholin's and Skene's glands.    Vagina: Normal vaginal mucosa, no evidence of prolapse.    Cervix: surgically absent  Uterus: surgically absent  Adnexa: ovaries non-enlarged, no adnexal masses  Rectal: deferred  Lymphatic: no evidence of inguinal lymphadenopathy Extremities: no edema, erythema, or tenderness Neurologic: Grossly intact Psychiatric: mood appropriate, affect full  Female chaperone present for pelvic and breast  portions of the physical exam    Assessment: 41 y.o. G1P1 No problem-specific Assessment & Plan notes found for this encounter.   Plan: Problem List Items Addressed This Visit    None      1) Mammogram - recommend yearly screening mammogram.  Mammogram Was ordered today   2) STI screening was not offered  3) ASCCP guidelines and rational discussed.  Patient opts for every 3 years screening interval  4) Contraception - N/A s/p hysterectomy  5) Colonoscopy -- Screening recommended starting at age 42 for average risk individuals, age 10540 for individuals deemed at increased risk (including African Americans) and recommended to continue until age 42.  For patient age 42-85 individualized approach is recommended.  Gold standard screening is via colonoscopy, Cologuard screening is an acceptable alternative for patient unwilling or unable to undergo colonoscopy.  "Colorectal cancer screening for average?risk adults: 2018 guideline update from the American Cancer Society"CA: A Cancer Journal for Clinicians: Nov 14, 2016  - up to date  getting regular colonoscopies 2/2 Crohn's  6) Routine healthcare maintenance including cholesterol, diabetes screening discussed managed by PCP

## 2017-01-14 NOTE — Patient Instructions (Signed)
Preventive Care 40-64 Years, Female Preventive care refers to lifestyle choices and visits with your health care provider that can promote health and wellness. What does preventive care include?  A yearly physical exam. This is also called an annual well check.  Dental exams once or twice a year.  Routine eye exams. Ask your health care provider how often you should have your eyes checked.  Personal lifestyle choices, including: ? Daily care of your teeth and gums. ? Regular physical activity. ? Eating a healthy diet. ? Avoiding tobacco and drug use. ? Limiting alcohol use. ? Practicing safe sex. ? Taking low-dose aspirin daily starting at age 58. ? Taking vitamin and mineral supplements as recommended by your health care provider. What happens during an annual well check? The services and screenings done by your health care provider during your annual well check will depend on your age, overall health, lifestyle risk factors, and family history of disease. Counseling Your health care provider may ask you questions about your:  Alcohol use.  Tobacco use.  Drug use.  Emotional well-being.  Home and relationship well-being.  Sexual activity.  Eating habits.  Work and work Statistician.  Method of birth control.  Menstrual cycle.  Pregnancy history.  Screening You may have the following tests or measurements:  Height, weight, and BMI.  Blood pressure.  Lipid and cholesterol levels. These may be checked every 5 years, or more frequently if you are over 81 years old.  Skin check.  Lung cancer screening. You may have this screening every year starting at age 78 if you have a 30-pack-year history of smoking and currently smoke or have quit within the past 15 years.  Fecal occult blood test (FOBT) of the stool. You may have this test every year starting at age 65.  Flexible sigmoidoscopy or colonoscopy. You may have a sigmoidoscopy every 5 years or a colonoscopy  every 10 years starting at age 30.  Hepatitis C blood test.  Hepatitis B blood test.  Sexually transmitted disease (STD) testing.  Diabetes screening. This is done by checking your blood sugar (glucose) after you have not eaten for a while (fasting). You may have this done every 1-3 years.  Mammogram. This may be done every 1-2 years. Talk to your health care provider about when you should start having regular mammograms. This may depend on whether you have a family history of breast cancer.  BRCA-related cancer screening. This may be done if you have a family history of breast, ovarian, tubal, or peritoneal cancers.  Pelvic exam and Pap test. This may be done every 3 years starting at age 80. Starting at age 36, this may be done every 5 years if you have a Pap test in combination with an HPV test.  Bone density scan. This is done to screen for osteoporosis. You may have this scan if you are at high risk for osteoporosis.  Discuss your test results, treatment options, and if necessary, the need for more tests with your health care provider. Vaccines Your health care provider may recommend certain vaccines, such as:  Influenza vaccine. This is recommended every year.  Tetanus, diphtheria, and acellular pertussis (Tdap, Td) vaccine. You may need a Td booster every 10 years.  Varicella vaccine. You may need this if you have not been vaccinated.  Zoster vaccine. You may need this after age 5.  Measles, mumps, and rubella (MMR) vaccine. You may need at least one dose of MMR if you were born in  1957 or later. You may also need a second dose.  Pneumococcal 13-valent conjugate (PCV13) vaccine. You may need this if you have certain conditions and were not previously vaccinated.  Pneumococcal polysaccharide (PPSV23) vaccine. You may need one or two doses if you smoke cigarettes or if you have certain conditions.  Meningococcal vaccine. You may need this if you have certain  conditions.  Hepatitis A vaccine. You may need this if you have certain conditions or if you travel or work in places where you may be exposed to hepatitis A.  Hepatitis B vaccine. You may need this if you have certain conditions or if you travel or work in places where you may be exposed to hepatitis B.  Haemophilus influenzae type b (Hib) vaccine. You may need this if you have certain conditions.  Talk to your health care provider about which screenings and vaccines you need and how often you need them. This information is not intended to replace advice given to you by your health care provider. Make sure you discuss any questions you have with your health care provider. Document Released: 07/01/2015 Document Revised: 02/22/2016 Document Reviewed: 04/05/2015 Elsevier Interactive Patient Education  2017 [employer].

## 2017-02-11 ENCOUNTER — Ambulatory Visit
Admission: RE | Admit: 2017-02-11 | Discharge: 2017-02-11 | Disposition: A | Discharge status: Home / Self Care | Payer: 59 | Source: Ambulatory Visit | Attending: Obstetrics and Gynecology | Admitting: Obstetrics and Gynecology

## 2017-02-11 DIAGNOSIS — Z1231 Encounter for screening mammogram for malignant neoplasm of breast: Secondary | ICD-10-CM | POA: Insufficient documentation

## 2017-02-11 DIAGNOSIS — Z1239 Encounter for other screening for malignant neoplasm of breast: Secondary | ICD-10-CM

## 2017-02-19 ENCOUNTER — Encounter: Payer: Self-pay | Admitting: Obstetrics and Gynecology

## 2018-03-18 ENCOUNTER — Other Ambulatory Visit: Payer: Self-pay | Admitting: Obstetrics and Gynecology

## 2018-03-18 ENCOUNTER — Other Ambulatory Visit: Payer: Self-pay | Admitting: Family Medicine

## 2018-03-18 DIAGNOSIS — Z1231 Encounter for screening mammogram for malignant neoplasm of breast: Secondary | ICD-10-CM

## 2018-04-10 ENCOUNTER — Ambulatory Visit
Admission: RE | Admit: 2018-04-10 | Discharge: 2018-04-10 | Disposition: A | Discharge status: Home / Self Care | Payer: Managed Care, Other (non HMO) | Source: Ambulatory Visit | Attending: Family Medicine | Admitting: Family Medicine

## 2018-04-10 DIAGNOSIS — Z1231 Encounter for screening mammogram for malignant neoplasm of breast: Secondary | ICD-10-CM | POA: Diagnosis not present

## 2019-03-31 ENCOUNTER — Other Ambulatory Visit: Payer: Self-pay | Admitting: Family Medicine

## 2019-03-31 DIAGNOSIS — Z1231 Encounter for screening mammogram for malignant neoplasm of breast: Secondary | ICD-10-CM

## 2019-04-30 ENCOUNTER — Other Ambulatory Visit: Payer: Self-pay

## 2019-04-30 ENCOUNTER — Ambulatory Visit
Admission: RE | Admit: 2019-04-30 | Discharge: 2019-04-30 | Disposition: A | Discharge status: Home / Self Care | Payer: Managed Care, Other (non HMO) | Source: Ambulatory Visit | Attending: Family Medicine | Admitting: Family Medicine

## 2019-04-30 DIAGNOSIS — Z1231 Encounter for screening mammogram for malignant neoplasm of breast: Secondary | ICD-10-CM | POA: Insufficient documentation

## 2020-04-06 ENCOUNTER — Other Ambulatory Visit: Payer: Self-pay | Admitting: Family Medicine

## 2020-04-06 DIAGNOSIS — Z1231 Encounter for screening mammogram for malignant neoplasm of breast: Secondary | ICD-10-CM

## 2020-04-20 ENCOUNTER — Other Ambulatory Visit: Admission: RE | Admit: 2020-04-20 | Payer: Managed Care, Other (non HMO) | Source: Ambulatory Visit

## 2020-04-22 ENCOUNTER — Ambulatory Visit: Admission: RE | Admit: 2020-04-22 | Payer: Managed Care, Other (non HMO) | Source: Home / Self Care

## 2020-04-22 ENCOUNTER — Encounter: Admission: RE | Payer: Self-pay | Source: Home / Self Care

## 2020-04-22 SURGERY — COLONOSCOPY WITH PROPOFOL
Anesthesia: General

## 2020-05-11 ENCOUNTER — Other Ambulatory Visit: Payer: Self-pay

## 2020-05-11 ENCOUNTER — Ambulatory Visit
Admission: RE | Admit: 2020-05-11 | Discharge: 2020-05-11 | Disposition: A | Discharge status: Home / Self Care | Payer: Managed Care, Other (non HMO) | Source: Ambulatory Visit | Attending: Family Medicine | Admitting: Family Medicine

## 2020-05-11 DIAGNOSIS — Z1231 Encounter for screening mammogram for malignant neoplasm of breast: Secondary | ICD-10-CM | POA: Insufficient documentation

## 2020-05-20 ENCOUNTER — Other Ambulatory Visit: Payer: Self-pay | Admitting: Family Medicine

## 2020-05-20 DIAGNOSIS — R928 Other abnormal and inconclusive findings on diagnostic imaging of breast: Secondary | ICD-10-CM

## 2020-05-20 DIAGNOSIS — R921 Mammographic calcification found on diagnostic imaging of breast: Secondary | ICD-10-CM

## 2020-05-27 ENCOUNTER — Ambulatory Visit
Admission: RE | Admit: 2020-05-27 | Discharge: 2020-05-27 | Disposition: A | Discharge status: Home / Self Care | Payer: Managed Care, Other (non HMO) | Source: Ambulatory Visit | Attending: Family Medicine | Admitting: Family Medicine

## 2020-05-27 ENCOUNTER — Other Ambulatory Visit: Payer: Self-pay

## 2020-05-27 DIAGNOSIS — R921 Mammographic calcification found on diagnostic imaging of breast: Secondary | ICD-10-CM | POA: Diagnosis present

## 2020-05-27 DIAGNOSIS — R928 Other abnormal and inconclusive findings on diagnostic imaging of breast: Secondary | ICD-10-CM | POA: Diagnosis not present

## 2020-08-03 ENCOUNTER — Other Ambulatory Visit
Admission: RE | Admit: 2020-08-03 | Discharge: 2020-08-03 | Disposition: A | Discharge status: Home / Self Care | Payer: Managed Care, Other (non HMO) | Source: Ambulatory Visit | Attending: Gastroenterology | Admitting: Gastroenterology

## 2020-08-03 ENCOUNTER — Other Ambulatory Visit: Payer: Self-pay

## 2020-08-03 DIAGNOSIS — Z01812 Encounter for preprocedural laboratory examination: Secondary | ICD-10-CM | POA: Diagnosis present

## 2020-08-03 DIAGNOSIS — Z20822 Contact with and (suspected) exposure to covid-19: Secondary | ICD-10-CM | POA: Diagnosis not present

## 2020-08-03 LAB — SARS CORONAVIRUS 2 (TAT 6-24 HRS): SARS Coronavirus 2: NEGATIVE

## 2020-08-04 ENCOUNTER — Encounter: Payer: Self-pay | Admitting: *Deleted

## 2020-08-05 ENCOUNTER — Encounter
Admission: RE | Disposition: A | Discharge status: Home / Self Care | Payer: Self-pay | Source: Home / Self Care | Attending: Gastroenterology

## 2020-08-05 ENCOUNTER — Other Ambulatory Visit: Payer: Self-pay

## 2020-08-05 ENCOUNTER — Ambulatory Visit: Payer: Managed Care, Other (non HMO) | Admitting: Certified Registered"

## 2020-08-05 ENCOUNTER — Ambulatory Visit
Admission: RE | Admit: 2020-08-05 | Discharge: 2020-08-05 | Disposition: A | Discharge status: Home / Self Care | Payer: Managed Care, Other (non HMO) | Attending: Gastroenterology | Admitting: Gastroenterology

## 2020-08-05 DIAGNOSIS — Z980 Intestinal bypass and anastomosis status: Secondary | ICD-10-CM | POA: Diagnosis not present

## 2020-08-05 DIAGNOSIS — Z79899 Other long term (current) drug therapy: Secondary | ICD-10-CM | POA: Insufficient documentation

## 2020-08-05 DIAGNOSIS — K5000 Crohn's disease of small intestine without complications: Secondary | ICD-10-CM | POA: Diagnosis present

## 2020-08-05 HISTORY — PX: COLONOSCOPY WITH PROPOFOL: SHX5780

## 2020-08-05 SURGERY — COLONOSCOPY WITH PROPOFOL
Anesthesia: General

## 2020-08-05 MED ORDER — PROPOFOL 500 MG/50ML IV EMUL
INTRAVENOUS | Status: DC | PRN
  Administered 2020-08-05: 130 ug/kg/min via INTRAVENOUS

## 2020-08-05 MED ORDER — LIDOCAINE HCL (CARDIAC) PF 100 MG/5ML IV SOSY
PREFILLED_SYRINGE | INTRAVENOUS | Status: DC | PRN
  Administered 2020-08-05: 50 mg via INTRAVENOUS

## 2020-08-05 MED ORDER — PROPOFOL 500 MG/50ML IV EMUL
INTRAVENOUS | Status: AC
  Filled 2020-08-05: qty 50

## 2020-08-05 MED ORDER — PROPOFOL 10 MG/ML IV BOLUS
INTRAVENOUS | Status: DC | PRN
  Administered 2020-08-05: 80 mg via INTRAVENOUS

## 2020-08-05 MED ORDER — SODIUM CHLORIDE 0.9 % IV SOLN
INTRAVENOUS | Status: DC
  Administered 2020-08-05: 20 mL/h via INTRAVENOUS

## 2020-08-05 NOTE — Transfer of Care (Signed)
Immediate Anesthesia Transfer of Care Note  Patient: Alicia Townsend  Procedure(s) Performed: COLONOSCOPY WITH PROPOFOL (N/A )  Patient Location: PACU and Endoscopy Unit  Anesthesia Type:General  Level of Consciousness: drowsy  Airway & Oxygen Therapy: Patient Spontanous Breathing and Patient connected to T-piece oxygen  Post-op Assessment: Report given to RN  Post vital signs: stable  Last Vitals:  Vitals Value Taken Time  BP    Temp    Pulse    Resp    SpO2      Last Pain:  Vitals:   08/05/20 1036  TempSrc: Temporal  PainSc: 0-No pain         Complications: No complications documented.

## 2020-08-05 NOTE — Interval H&P Note (Signed)
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  The various methods of treatment have been discussed with the patient and family. After consideration of risks, benefits and other options for treatment, the patient has consented to  Procedure(s): COLONOSCOPY WITH PROPOFOL (N/A) as a surgical intervention.  The patient's history has been reviewed, patient examined, no change in status, stable for surgery.  I have reviewed the patient's chart and labs.  Questions were answered to the patient's satisfaction.     Regis Bill  Ok to proceed with colonoscopy

## 2020-08-05 NOTE — Op Note (Signed)
[facility] Patient Name: Alicia Townsend Procedure Date: 08/05/2020 11:13 AM MRN: [id number] Account #: [id number] Date of Birth: 1975-12-14 Admit Type: Outpatient Age: 45 Room: [facility] ENDO ROOM 3 Gender: Female Note Status: Finalized Procedure:             Colonoscopy Indications:           Crohn's disease of the small bowel Providers:             Andrey Farmer MD, MD Referring MD:          Caprice Renshaw MD (Referring MD) Medicines:             Monitored Anesthesia Care Complications:         No immediate complications. Estimated blood loss:                         Minimal. Procedure:             Pre-Anesthesia Assessment:                        - Prior to the procedure, a History and Physical was                         performed, and patient medications and allergies were                         reviewed. The patient is competent. The risks and                         benefits of the procedure and the sedation options and                         risks were discussed with the patient. All questions                         were answered and informed consent was obtained.                         Patient identification and proposed procedure were                         verified by the physician, the nurse, the anesthetist                         and the technician in the endoscopy suite. Mental                         Status Examination: alert and oriented. Airway                         Examination: normal oropharyngeal airway and neck                         mobility. Respiratory Examination: clear to                         auscultation. CV Examination: normal. Prophylactic  Antibiotics: The patient does not require prophylactic                         antibiotics. Prior Anticoagulants: The patient has                         taken no previous anticoagulant or antiplatelet                         agents. ASA Grade  Assessment: II - A patient with mild                         systemic disease. After reviewing the risks and                         benefits, the patient was deemed in satisfactory                         condition to undergo the procedure. The anesthesia                         plan was to use monitored anesthesia care (MAC).                         Immediately prior to administration of medications,                         the patient was re-assessed for adequacy to receive                         sedatives. The heart rate, respiratory rate, oxygen                         saturations, blood pressure, adequacy of pulmonary                         ventilation, and response to care were monitored                         throughout the procedure. The physical status of the                         patient was re-assessed after the procedure.                        After obtaining informed consent, the colonoscope was                         passed under direct vision. Throughout the procedure,                         the patient's blood pressure, pulse, and oxygen                         saturations were monitored continuously. The                         Colonoscope was introduced through the anus and  advanced to the the ileocolonic anastomosis. The                         colonoscopy was performed without difficulty. The                         patient tolerated the procedure well. The quality of                         the bowel preparation was excellent. Findings:      The perianal and digital rectal examinations were normal.      There was evidence of a patent end-to-side ileo-colonic anastomosis       found in the neo-terminal Ileum. This was characterized by healthy       appearing mucosa.      Inflammation was not found based on the endoscopic appearance of the       mucosa. This was graded as Rutgeerts Score i0 (no lesions in the distal       ileum), and  when compared to previous examinations, the findings are in       remission. Biopsies were taken with a cold forceps for histology.       Estimated blood loss was minimal. Impression:            - Patent end-to-side ileo-colonic anastomosis,                         characterized by healthy appearing mucosa.                        - Inflammation was not found on today's exam (based on                         the endoscopic appearance of the mucosa). This was                         graded as Rutgeerts Score i0 (no lesions in the distal                         ileum), in remission compared to previous                         examinations. Biopsied. Recommendation:        - Discharge patient to home.                        - Resume previous diet.                        - Continue present medications.                        - Await pathology results.                        - Repeat colonoscopy for surveillance based on                         pathology results.                        -  Return to referring physician as previously                         scheduled. Would recommend clinic visits every 6                         months with fecal calprotectin checked at each visit.                         Consider MR enterography periodically. Procedure Code(s):     --- Professional ---                        [phone], Colonoscopy, flexible; with biopsy, single or                         multiple Diagnosis Code(s):     --- Professional ---                        Z98.0, Intestinal bypass and anastomosis status                        K50.00, Crohn's disease of small intestine without                         complications CPT copyright 2019 American Medical Association. All rights reserved. The codes documented in this report are preliminary and upon coder review may  be revised to meet current compliance requirements. Andrey Farmer MD, MD 08/05/2020 11:40:47 AM Number of Addenda: 0 Note  Initiated On: 08/05/2020 11:13 AM Scope Withdrawal Time: 0 hours 7 minutes 9 seconds  Total Procedure Duration: 0 hours 10 minutes 33 seconds  Estimated Blood Loss:  Estimated blood loss was minimal.      [facility]

## 2020-08-05 NOTE — H&P (Signed)
Outpatient short stay form Pre-procedure 08/05/2020 11:11 AM Merlyn Lot MD, MPH  Primary Physician: Dr. 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 Was on cimzia for several years but has been off for several years with no symptoms. History of hysterectomy. No family history of GI malignancies. No blood thinners.    Current Facility-Administered Medications:  .  0.9 %  sodium chloride infusion, , Intravenous, Continuous, Amaro Mangold, Rossie Muskrat, MD, Last Rate: 20 mL/hr at 08/05/20 1048, 20 mL/hr at 08/05/20 1048  Medications Prior to Admission  Medication Sig Dispense Refill Last Dose  . escitalopram (LEXAPRO) 10 MG tablet Take by mouth.        No Known Allergies   Past Medical History:  Diagnosis Date  . Chicken pox   . Complication of anesthesia   . Crohn disease (HCC)   . Endometriosis   . Family history of adverse reaction to anesthesia    grandfather. will try to get more info from family  . Medical history non-contributory     Review of systems:  Otherwise negative.    Physical Exam  Gen: Alert, oriented. Appears stated age.  HEENT: PERRLA. Lungs: No respiratory distress CV: RRR. Abd: soft, benign, no masses Ext: No edema    Planned procedures: Proceed with colonoscopy. The patient understands the nature of the planned procedure, indications, risks, alternatives and potential complications including but not limited to bleeding, infection, perforation, damage to internal organs and possible oversedation/side effects from anesthesia. The patient agrees and gives consent to proceed.  Please refer to procedure notes for findings, recommendations and patient disposition/instructions.     Merlyn Lot MD,  [facility] 08/05/2020  11:11 AM

## 2020-08-07 NOTE — Anesthesia Preprocedure Evaluation (Signed)
Anesthesia Evaluation  Patient identified by MRN, date of birth, ID band Patient awake    Reviewed: Allergy & Precautions, H&P , NPO status , Patient's Chart, lab work & pertinent test results, reviewed documented beta blocker date and time   History of Anesthesia Complications (+) Family history of anesthesia reaction and history of anesthetic complications  Airway Mallampati: II   Neck ROM: full    Dental  (+) Poor Dentition   Pulmonary neg pulmonary ROS,    Pulmonary exam normal        Cardiovascular negative cardio ROS Normal cardiovascular exam Rhythm:regular Rate:Normal     Neuro/Psych negative neurological ROS  negative psych ROS   GI/Hepatic negative GI ROS, Neg liver ROS,   Endo/Other  negative endocrine ROS  Renal/GU negative Renal ROS  negative genitourinary   Musculoskeletal   Abdominal   Peds  Hematology negative hematology ROS (+)   Anesthesia Other Findings Past Medical History: No date: Chicken pox No date: Complication of anesthesia No date: Crohn disease (HCC) No date: Endometriosis No date: Family history of adverse reaction to anesthesia     Comment:  grandfather. will try to get more info from family No date: Medical history non-contributory Past Surgical History: No date: ABDOMINAL HYSTERECTOMY No date: CERVICAL BIOPSY  W/ LOOP ELECTRODE EXCISION No date: COLON SURGERY     Comment:  partial enterectomy with repair of sigmoid colon 05/07/2016: COLONOSCOPY WITH PROPOFOL; N/A     Comment:  Procedure: COLONOSCOPY WITH PROPOFOL;  Surgeon: Christena Deem, MD;  Location: [facility] ENDOSCOPY;  Service:               Endoscopy;  Laterality: N/A; 06/05/2016: CYSTOSCOPY     Comment:  Procedure: CYSTOSCOPY;  Surgeon: Vena Austria, MD;                Location: [facility] ORS;  Service: Gynecology;; No date: DIAGNOSTIC LAPAROSCOPY 06/05/2016: LAPAROSCOPIC HYSTERECTOMY; Bilateral      Comment:  Procedure: HYSTERECTOMY TOTAL LAPAROSCOPIC;  Surgeon:               Vena Austria, MD;  Location: [facility] ORS;  Service:               Gynecology;  Laterality: Bilateral; No date: repair of sigmoid enterectomy; N/A BMI    Body Mass Index: 25.69 kg/m     Reproductive/Obstetrics negative OB ROS                             Anesthesia Physical Anesthesia Plan  ASA: II  Anesthesia Plan: General   Post-op Pain Management:    Induction:   PONV Risk Score and Plan:   Airway Management Planned:   Additional Equipment:   Intra-op Plan:   Post-operative Plan:   Informed Consent: I have reviewed the patients History and Physical, chart, labs and discussed the procedure including the risks, benefits and alternatives for the proposed anesthesia with the patient or authorized representative who has indicated his/her understanding and acceptance.     Dental Advisory Given  Plan Discussed with: CRNA  Anesthesia Plan Comments:         Anesthesia Quick Evaluation

## 2020-08-07 NOTE — Anesthesia Postprocedure Evaluation (Signed)
Anesthesia Post Note  Patient: Alicia Townsend  Procedure(s) Performed: COLONOSCOPY WITH PROPOFOL (N/A )  Patient location during evaluation: PACU Anesthesia Type: General Level of consciousness: awake and alert Pain management: pain level controlled Vital Signs Assessment: post-procedure vital signs reviewed and stable Respiratory status: spontaneous breathing, nonlabored ventilation, respiratory function stable and patient connected to nasal cannula oxygen Cardiovascular status: blood pressure returned to baseline and stable Postop Assessment: no apparent nausea or vomiting Anesthetic complications: no   No complications documented.   Last Vitals:  Vitals:   08/05/20 1155 08/05/20 1205  BP: 115/74 134/75  Pulse: 77 75  Resp: 17 17  Temp:    SpO2: 99% 99%    Last Pain:  Vitals:   08/06/20 1331  TempSrc:   PainSc: 0-No pain                 Yevette Edwards

## 2020-08-08 ENCOUNTER — Encounter: Payer: Self-pay | Admitting: Gastroenterology

## 2020-08-08 LAB — SURGICAL PATHOLOGY

## 2021-01-13 ENCOUNTER — Other Ambulatory Visit: Payer: Self-pay | Admitting: Family Medicine

## 2021-01-13 DIAGNOSIS — R928 Other abnormal and inconclusive findings on diagnostic imaging of breast: Secondary | ICD-10-CM

## 2021-01-16 ENCOUNTER — Other Ambulatory Visit: Payer: Self-pay

## 2021-01-16 ENCOUNTER — Ambulatory Visit
Admission: RE | Admit: 2021-01-16 | Discharge: 2021-01-16 | Disposition: A | Discharge status: Home / Self Care | Payer: Managed Care, Other (non HMO) | Source: Ambulatory Visit | Attending: Family Medicine | Admitting: Family Medicine

## 2021-01-16 DIAGNOSIS — R928 Other abnormal and inconclusive findings on diagnostic imaging of breast: Secondary | ICD-10-CM

## 2021-01-19 ENCOUNTER — Other Ambulatory Visit: Payer: Self-pay | Admitting: Family Medicine

## 2021-01-19 DIAGNOSIS — R921 Mammographic calcification found on diagnostic imaging of breast: Secondary | ICD-10-CM

## 2021-07-26 ENCOUNTER — Other Ambulatory Visit: Payer: Self-pay

## 2021-07-26 ENCOUNTER — Ambulatory Visit
Admission: RE | Admit: 2021-07-26 | Discharge: 2021-07-26 | Disposition: A | Discharge status: Home / Self Care | Payer: Managed Care, Other (non HMO) | Source: Ambulatory Visit | Attending: Family Medicine | Admitting: Family Medicine

## 2021-07-26 DIAGNOSIS — R921 Mammographic calcification found on diagnostic imaging of breast: Secondary | ICD-10-CM | POA: Diagnosis not present

## 2021-08-07 ENCOUNTER — Other Ambulatory Visit: Payer: Self-pay | Admitting: Family Medicine

## 2021-08-07 DIAGNOSIS — R928 Other abnormal and inconclusive findings on diagnostic imaging of breast: Secondary | ICD-10-CM

## 2021-08-07 DIAGNOSIS — R921 Mammographic calcification found on diagnostic imaging of breast: Secondary | ICD-10-CM

## 2021-08-07 DIAGNOSIS — N6489 Other specified disorders of breast: Secondary | ICD-10-CM

## 2022-01-23 ENCOUNTER — Ambulatory Visit
Admission: RE | Admit: 2022-01-23 | Discharge: 2022-01-23 | Disposition: A | Discharge status: Home / Self Care | Payer: Managed Care, Other (non HMO) | Source: Ambulatory Visit | Attending: Family Medicine | Admitting: Family Medicine

## 2022-01-23 DIAGNOSIS — R928 Other abnormal and inconclusive findings on diagnostic imaging of breast: Secondary | ICD-10-CM | POA: Insufficient documentation

## 2022-01-23 DIAGNOSIS — N6489 Other specified disorders of breast: Secondary | ICD-10-CM | POA: Diagnosis present

## 2022-01-23 DIAGNOSIS — R921 Mammographic calcification found on diagnostic imaging of breast: Secondary | ICD-10-CM | POA: Diagnosis present

## 2022-04-08 IMAGING — MG DIGITAL DIAGNOSTIC BILAT W/ TOMO W/ CAD
6 of 10 series · 6 of 26 positions shown · non-contrast
Comparison: Previous exam(s).

CLINICAL DATA: One year follow-up of probable benign calcifications
in the right breast. Six-month follow-up of a probable benign
asymmetry in the right breast.

EXAM:
DIGITAL DIAGNOSTIC BILATERAL MAMMOGRAM WITH TOMOSYNTHESIS AND CAD
TECHNIQUE: Bilateral digital diagnostic mammography and breast tomosynthesis
was performed. The images were evaluated with computer-aided
detection.

[R ML]
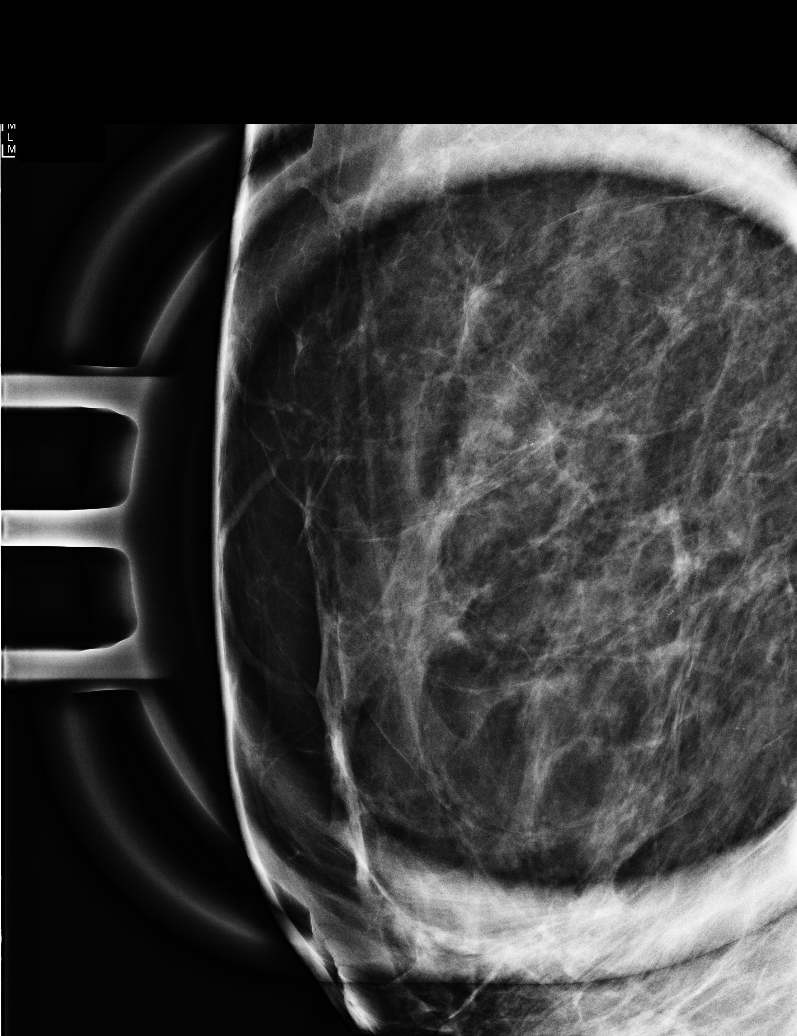

[R CC]
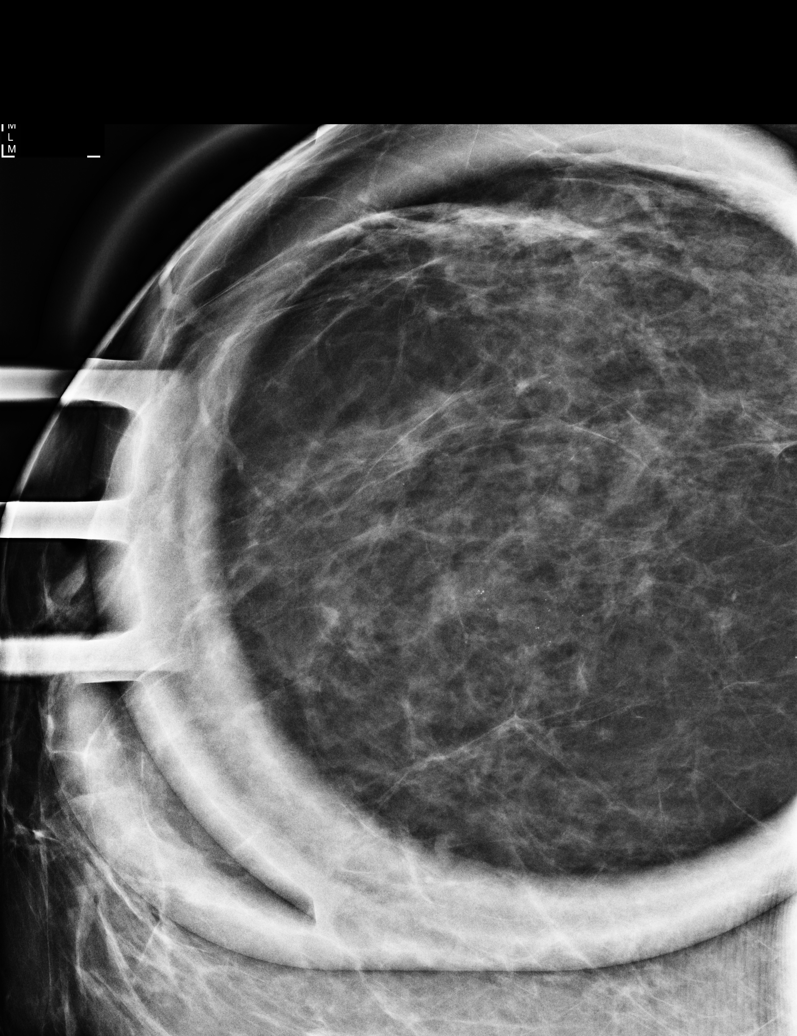

[R CC synth-2D]
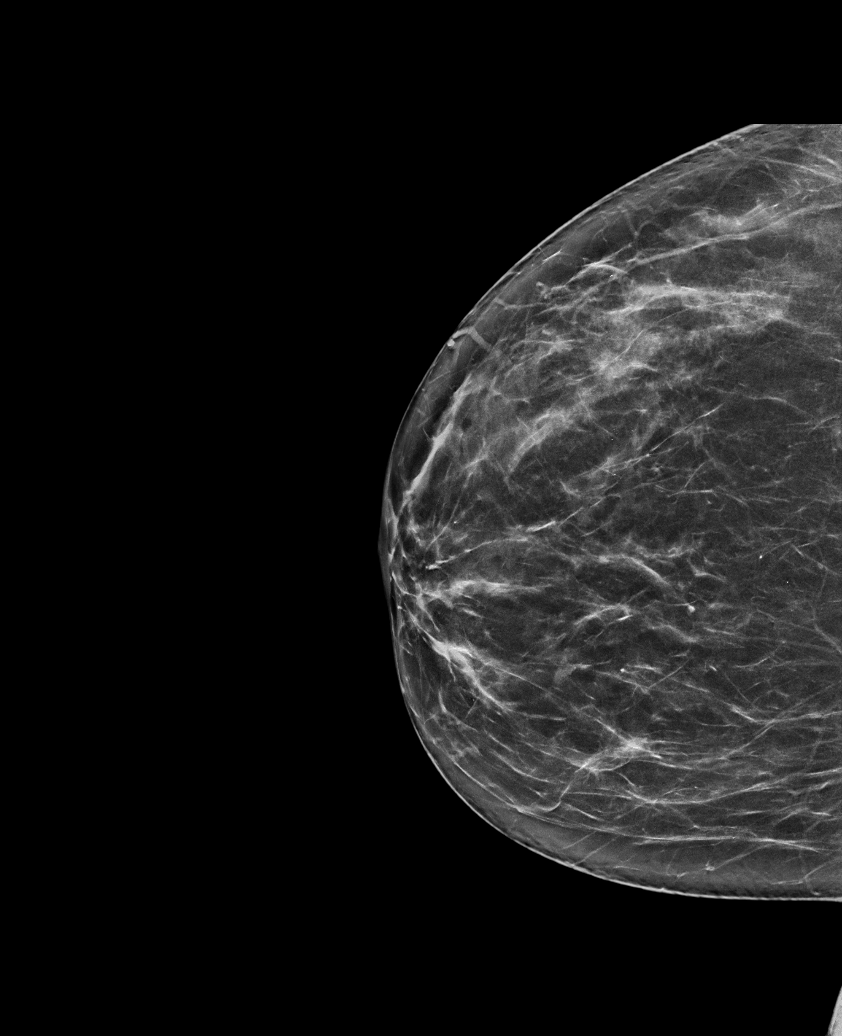

[R MLO synth-2D]
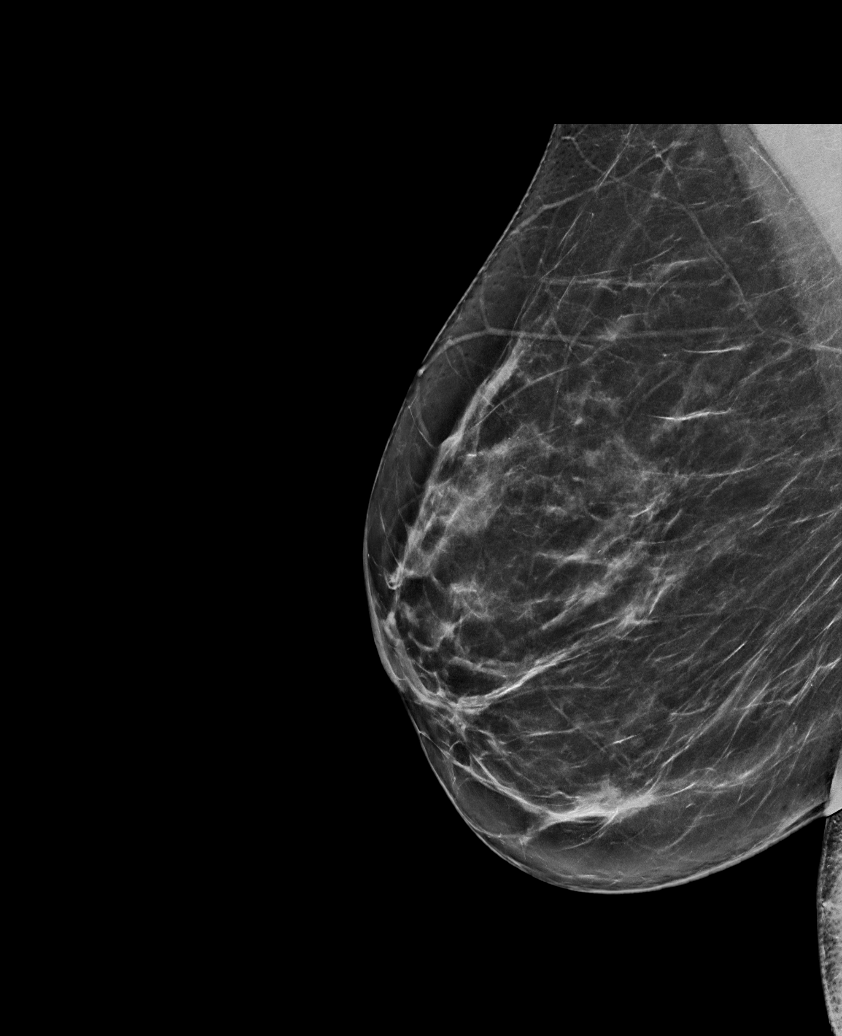

[L MLO synth-2D]
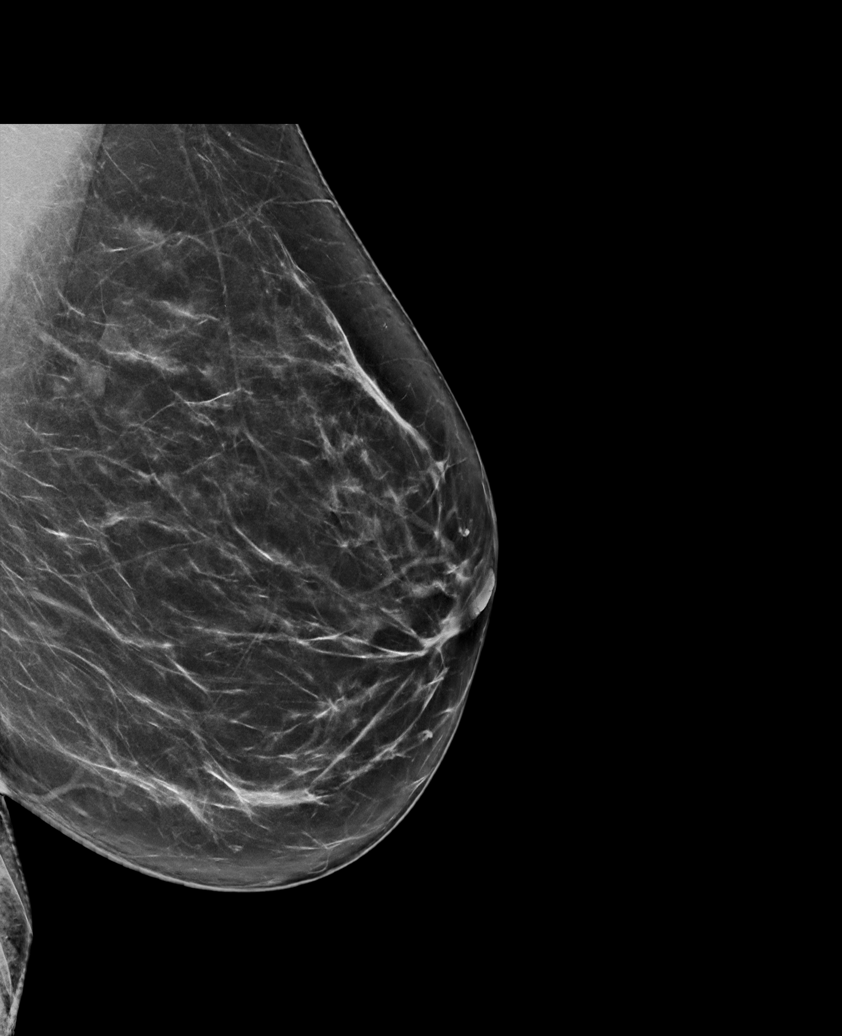

[L CC synth-2D]
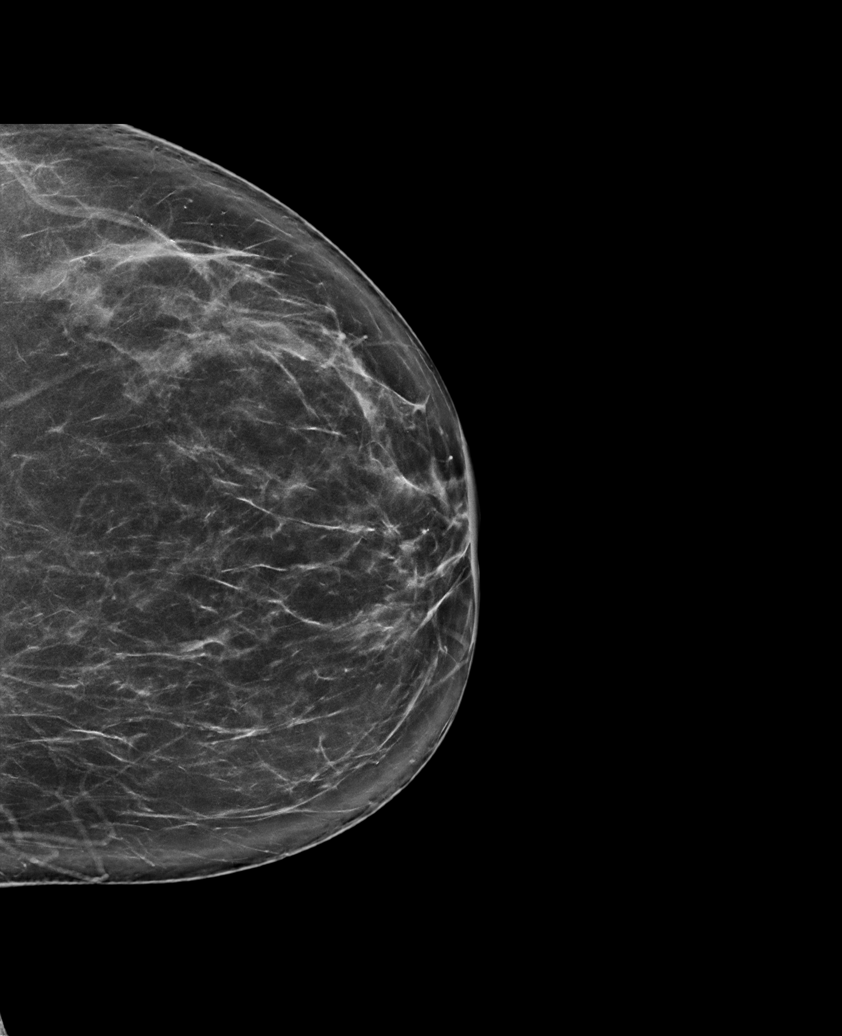

[6 of 26 positions shown; findings below may reference images not displayed]

ACR Breast Density Category b: There are scattered areas of
fibroglandular density.
FINDINGS: Punctate loosely grouped calcifications in the upper-outer quadrant
of the right breast with another tiny grouped more inferior in the
upper-outer quadrant are stable compared to prior mammogram dated
05/27/2020. The asymmetry seen in the superior aspect of the right
breast is also stable compared to diagnostic exam dated 01/16/2021.
No suspicious mass or malignant type microcalcifications identified.

No suspicious mass or malignant type microcalcifications identified
in the left breast.
IMPRESSION: Stable probable benign calcifications and asymmetry in the right
breast.

RECOMMENDATION:
Right diagnostic mammogram in 6 months is recommended. This will
document stability of the calcifications for 1-1/2 years and
stability of the probable benign asymmetry for 1 year.

I have discussed the findings and recommendations with the patient.
If applicable, a reminder letter will be sent to the patient
regarding the next appointment.

BI-RADS CATEGORY  3: Probably benign.

## 2022-08-27 ENCOUNTER — Other Ambulatory Visit: Payer: Self-pay | Admitting: Family Medicine

## 2022-08-27 DIAGNOSIS — R921 Mammographic calcification found on diagnostic imaging of breast: Secondary | ICD-10-CM

## 2022-08-27 DIAGNOSIS — N6489 Other specified disorders of breast: Secondary | ICD-10-CM

## 2022-09-11 ENCOUNTER — Ambulatory Visit
Admission: RE | Admit: 2022-09-11 | Discharge: 2022-09-11 | Disposition: A | Discharge status: Home / Self Care | Payer: Managed Care, Other (non HMO) | Source: Ambulatory Visit | Attending: Family Medicine | Admitting: Family Medicine

## 2022-09-11 DIAGNOSIS — N6489 Other specified disorders of breast: Secondary | ICD-10-CM | POA: Diagnosis not present

## 2022-09-11 DIAGNOSIS — R921 Mammographic calcification found on diagnostic imaging of breast: Secondary | ICD-10-CM

## 2023-09-12 ENCOUNTER — Other Ambulatory Visit: Payer: Self-pay | Admitting: Family Medicine

## 2023-09-12 DIAGNOSIS — Z1231 Encounter for screening mammogram for malignant neoplasm of breast: Secondary | ICD-10-CM

## 2023-09-20 ENCOUNTER — Ambulatory Visit
Admission: RE | Admit: 2023-09-20 | Discharge: 2023-09-20 | Disposition: A | Discharge status: Home / Self Care | Source: Ambulatory Visit | Attending: Family Medicine | Admitting: Family Medicine

## 2023-09-20 DIAGNOSIS — Z1231 Encounter for screening mammogram for malignant neoplasm of breast: Secondary | ICD-10-CM | POA: Insufficient documentation

## 2024-06-30 ENCOUNTER — Ambulatory Visit: Payer: Self-pay

## 2024-06-30 DIAGNOSIS — K5010 Crohn's disease of large intestine without complications: Secondary | ICD-10-CM | POA: Diagnosis present

## 2024-06-30 DIAGNOSIS — K6289 Other specified diseases of anus and rectum: Secondary | ICD-10-CM | POA: Diagnosis not present
